# Patient Record
Sex: Male | Born: 1940 | Race: Black or African American | Hispanic: No | Marital: Married | Smoking: Current every day smoker
Health system: Southern US, Community
[De-identification: ages and names within clinical notes are randomized; demographics above are authoritative.]

## PROBLEM LIST (undated history)

## (undated) DIAGNOSIS — D751 Secondary polycythemia: Secondary | ICD-10-CM

## (undated) DIAGNOSIS — C61 Malignant neoplasm of prostate: Secondary | ICD-10-CM

## (undated) DIAGNOSIS — R350 Frequency of micturition: Secondary | ICD-10-CM

## (undated) DIAGNOSIS — I10 Essential (primary) hypertension: Secondary | ICD-10-CM

## (undated) DIAGNOSIS — R3129 Other microscopic hematuria: Secondary | ICD-10-CM

## (undated) DIAGNOSIS — N400 Enlarged prostate without lower urinary tract symptoms: Secondary | ICD-10-CM

## (undated) DIAGNOSIS — K219 Gastro-esophageal reflux disease without esophagitis: Secondary | ICD-10-CM

## (undated) DIAGNOSIS — N529 Male erectile dysfunction, unspecified: Secondary | ICD-10-CM

## (undated) DIAGNOSIS — E7800 Pure hypercholesterolemia, unspecified: Secondary | ICD-10-CM

## (undated) DIAGNOSIS — D649 Anemia, unspecified: Secondary | ICD-10-CM

## (undated) HISTORY — DX: Benign prostatic hyperplasia without lower urinary tract symptoms: N40.0

## (undated) HISTORY — DX: Gastro-esophageal reflux disease without esophagitis: K21.9

## (undated) HISTORY — DX: Pure hypercholesterolemia, unspecified: E78.00

## (undated) HISTORY — DX: Male erectile dysfunction, unspecified: N52.9

## (undated) HISTORY — DX: Anemia, unspecified: D64.9

## (undated) HISTORY — DX: Other microscopic hematuria: R31.29

## (undated) HISTORY — DX: Frequency of micturition: R35.0

## (undated) HISTORY — DX: Secondary polycythemia: D75.1

## (undated) HISTORY — DX: Malignant neoplasm of prostate: C61

## (undated) HISTORY — DX: Essential (primary) hypertension: I10

---

## 2006-09-02 DIAGNOSIS — C61 Malignant neoplasm of prostate: Secondary | ICD-10-CM

## 2006-09-02 HISTORY — DX: Malignant neoplasm of prostate: C61

## 2010-02-22 ENCOUNTER — Ambulatory Visit: Payer: Self-pay | Admitting: Urology

## 2010-04-02 ENCOUNTER — Ambulatory Visit: Payer: Self-pay | Admitting: Radiation Oncology

## 2010-04-30 ENCOUNTER — Ambulatory Visit: Payer: Self-pay | Admitting: Radiation Oncology

## 2010-05-03 ENCOUNTER — Ambulatory Visit: Payer: Self-pay | Admitting: Radiation Oncology

## 2010-05-19 ENCOUNTER — Emergency Department: Payer: Self-pay | Admitting: Emergency Medicine

## 2010-07-04 ENCOUNTER — Ambulatory Visit: Payer: Self-pay | Admitting: Radiation Oncology

## 2010-07-19 ENCOUNTER — Ambulatory Visit: Payer: Self-pay | Admitting: Urology

## 2010-07-31 ENCOUNTER — Ambulatory Visit: Payer: Self-pay | Admitting: Anesthesiology

## 2010-08-01 ENCOUNTER — Ambulatory Visit: Payer: Self-pay | Admitting: Urology

## 2010-08-02 ENCOUNTER — Ambulatory Visit: Payer: Self-pay | Admitting: Radiation Oncology

## 2010-09-02 ENCOUNTER — Ambulatory Visit: Payer: Self-pay | Admitting: Radiation Oncology

## 2010-09-17 ENCOUNTER — Inpatient Hospital Stay: Payer: Self-pay | Admitting: Internal Medicine

## 2010-09-18 LAB — PSA

## 2010-12-28 ENCOUNTER — Ambulatory Visit: Payer: Self-pay | Admitting: Radiation Oncology

## 2010-12-29 LAB — PSA

## 2011-01-01 ENCOUNTER — Ambulatory Visit: Payer: Self-pay | Admitting: Radiation Oncology

## 2011-06-27 ENCOUNTER — Ambulatory Visit: Payer: Self-pay | Admitting: Radiation Oncology

## 2011-07-04 ENCOUNTER — Ambulatory Visit: Payer: Self-pay | Admitting: Radiation Oncology

## 2012-06-26 ENCOUNTER — Ambulatory Visit: Payer: Self-pay | Admitting: Radiation Oncology

## 2012-07-03 ENCOUNTER — Ambulatory Visit: Payer: Self-pay | Admitting: Radiation Oncology

## 2012-08-10 DIAGNOSIS — N529 Male erectile dysfunction, unspecified: Secondary | ICD-10-CM | POA: Insufficient documentation

## 2012-08-10 DIAGNOSIS — R3129 Other microscopic hematuria: Secondary | ICD-10-CM | POA: Insufficient documentation

## 2012-08-10 DIAGNOSIS — R350 Frequency of micturition: Secondary | ICD-10-CM | POA: Insufficient documentation

## 2013-03-02 HISTORY — PX: CYSTOSCOPY: SUR368

## 2013-03-10 ENCOUNTER — Ambulatory Visit: Payer: Self-pay | Admitting: Urology

## 2013-03-24 ENCOUNTER — Ambulatory Visit: Payer: Self-pay | Admitting: Urology

## 2013-03-31 LAB — PATHOLOGY REPORT

## 2013-06-24 ENCOUNTER — Ambulatory Visit: Payer: Self-pay | Admitting: Radiation Oncology

## 2013-09-02 HISTORY — PX: TRANSURETHRAL RESECTION OF PROSTATE: SHX73

## 2014-03-10 DIAGNOSIS — E785 Hyperlipidemia, unspecified: Secondary | ICD-10-CM | POA: Insufficient documentation

## 2014-03-10 DIAGNOSIS — C61 Malignant neoplasm of prostate: Secondary | ICD-10-CM | POA: Insufficient documentation

## 2014-03-10 DIAGNOSIS — I10 Essential (primary) hypertension: Secondary | ICD-10-CM | POA: Insufficient documentation

## 2014-10-24 ENCOUNTER — Ambulatory Visit: Payer: Self-pay | Admitting: Internal Medicine

## 2014-11-01 ENCOUNTER — Ambulatory Visit
Admit: 2014-11-01 | Disposition: A | Discharge status: Home / Self Care | Payer: Self-pay | Attending: Internal Medicine | Admitting: Internal Medicine

## 2014-12-23 NOTE — Op Note (Signed)
PATIENT NAME:  Jerry Haynes, Jerry Haynes MR#:  [id number] DATE OF BIRTH:  1941-01-27  DATE OF PROCEDURE:  03/24/2013  PREOPERATIVE DIAGNOSES: 1.  Chronic urinary retention.  2.  Prostate cancer.   POSTOPERATIVE DIAGNOSES: 1.  Chronic urinary retention.  2.  Prostate cancer.   PROCEDURE:  Transurethral resection of prostate (channel).   SURGEON: John Giovanni, M.D.   ASSISTANT: None.   ANESTHESIA: General.   INDICATIONS: This is a 74 year old male with history of adenocarcinoma of the prostate diagnosed in 2008.  He initially elected active surveillance. However, opted for brachytherapy after a rising PSA. This was performed in 2011 and he developed acute urinary retention post procedure. He has had persistent urinary retention. He has been reluctant to pursue surgical management. He has been performing intermittent catheterization. He recently has opted for a channel TURP.  DESCRIPTION OF PROCEDURE: He was taken to the Operating Room where general anesthetic was administered. He was then placed in the low lithotomy position and his external genitalia were prepped and draped in the usual fashion. A timeout was performed per protocol with all in agreement to. A 27 French continuous flow resectoscope sheath with visual obturator was lubricated and passed per urethra. The obturator was replaced with an [place]. There was coapting tissue at the bladder neck and lateral lobes. Verumontanum was easily identified. No bladder mucosal lesions were identified. The ureteral orifices were normal-appearing with clear efflux and well away from the bladder neck. Bladder neck tissue was then resected down to capsular fibers. Lateral lobes were then resected from the bladder neck toward the verumontanum. There was adenoma around the verumontanum, which was superficially vaporized with the button electrode but not aggressively resected. Hemostasis was obtained with cautery. All chips were removed with  irrigation. Several prostate implant seeds were identified and sent with the specimen. At the completion of procedure, hemostasis was adequate the scope on minimal flow. The resectoscope was removed. A 22 French three-way Foley catheter was placed to continuous bladder irrigation. B and O suppository was placed per rectum. He was taken to the PACU in stable condition. There were no complications.   ESTIMATED BLOOD LOSS:  Minimal.     ____________________________ Ronda Fairly. Bernardo Heater, MD scs:dp D: 03/24/2013 13:52:14 ET T: 03/24/2013 16:09:12 ET JOB#: [id number]  cc: Nicki Reaper C. Bernardo Heater, MD, <Dictator> Abbie Sons MD ELECTRONICALLY SIGNED 04/05/2013 8:34

## 2015-10-19 ENCOUNTER — Encounter: Payer: Self-pay | Admitting: *Deleted

## 2015-10-19 ENCOUNTER — Other Ambulatory Visit: Payer: Self-pay | Admitting: *Deleted

## 2015-10-19 DIAGNOSIS — D751 Secondary polycythemia: Secondary | ICD-10-CM

## 2015-10-19 DIAGNOSIS — Z8546 Personal history of malignant neoplasm of prostate: Secondary | ICD-10-CM

## 2015-10-19 NOTE — Progress Notes (Signed)
Per md order, Added psa/cbc to patient's lab orders tomorrow.

## 2015-10-20 ENCOUNTER — Inpatient Hospital Stay: Payer: Medicare Other | Attending: Internal Medicine | Admitting: Internal Medicine

## 2015-10-20 ENCOUNTER — Inpatient Hospital Stay: Payer: Medicare Other

## 2015-10-20 VITALS — BP 156/93 | HR 87 | Temp 95.9°F | Ht 74.3 in | Wt 163.9 lb

## 2015-10-20 DIAGNOSIS — E7800 Pure hypercholesterolemia, unspecified: Secondary | ICD-10-CM

## 2015-10-20 DIAGNOSIS — I10 Essential (primary) hypertension: Secondary | ICD-10-CM | POA: Diagnosis not present

## 2015-10-20 DIAGNOSIS — C61 Malignant neoplasm of prostate: Secondary | ICD-10-CM

## 2015-10-20 DIAGNOSIS — K219 Gastro-esophageal reflux disease without esophagitis: Secondary | ICD-10-CM | POA: Insufficient documentation

## 2015-10-20 DIAGNOSIS — Z8546 Personal history of malignant neoplasm of prostate: Secondary | ICD-10-CM

## 2015-10-20 DIAGNOSIS — N400 Enlarged prostate without lower urinary tract symptoms: Secondary | ICD-10-CM | POA: Diagnosis not present

## 2015-10-20 DIAGNOSIS — Z79899 Other long term (current) drug therapy: Secondary | ICD-10-CM | POA: Insufficient documentation

## 2015-10-20 DIAGNOSIS — Z923 Personal history of irradiation: Secondary | ICD-10-CM | POA: Insufficient documentation

## 2015-10-20 DIAGNOSIS — D751 Secondary polycythemia: Secondary | ICD-10-CM

## 2015-10-20 DIAGNOSIS — F17210 Nicotine dependence, cigarettes, uncomplicated: Secondary | ICD-10-CM | POA: Insufficient documentation

## 2015-10-20 LAB — CBC WITH DIFFERENTIAL/PLATELET
Basophils Absolute: 0.1 10*3/uL (ref 0–0.1)
Basophils Relative: 1 %
EOS ABS: 0.1 10*3/uL (ref 0–0.7)
Eosinophils Relative: 1 %
HCT: 56.3 % — ABNORMAL HIGH (ref 40.0–52.0)
HEMOGLOBIN: 19.8 g/dL — AB (ref 13.0–18.0)
Lymphocytes Relative: 20 %
Lymphs Abs: 1.2 10*3/uL (ref 1.0–3.6)
MCH: 33.4 pg (ref 26.0–34.0)
MCHC: 35.1 g/dL (ref 32.0–36.0)
MCV: 95.2 fL (ref 80.0–100.0)
MONO ABS: 0.7 10*3/uL (ref 0.2–1.0)
MONOS PCT: 11 %
NEUTROS ABS: 4.2 10*3/uL (ref 1.4–6.5)
Neutrophils Relative %: 67 %
Platelets: 168 10*3/uL (ref 150–440)
RBC: 5.91 MIL/uL — ABNORMAL HIGH (ref 4.40–5.90)
RDW: 13.8 % (ref 11.5–14.5)
WBC: 6.3 10*3/uL (ref 3.8–10.6)

## 2015-10-20 LAB — PSA: PSA: 2.17 ng/mL (ref 0.00–4.00)

## 2015-10-20 NOTE — Progress Notes (Signed)
Patient brought to exam room 4, patient ambulates independently without assistance.  Vitals documented.  Patient denies pain or discomfort, states he has no medical changes since last visit.  Dr Rogue Bussing notified.

## 2015-10-20 NOTE — Progress Notes (Signed)
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   # 2008- Prostate cancer- Gleason score 3+3 [2011; s/p Brachytherapy]   INTERVAL HISTORY: Patient is a poor historian.  This is my first interaction with the patient since I joined the practice September 2016. I reviewed the patient's prior charts/pertinent labs in detail; findings are summarized above.   Pleasant 75 year old African-American male patient with above history of erythrocytosis likely secondary from smoking is here for follow-up.  Patient denies any difficulty breathing or headaches or history of blood clots.  Interestingly he denies even having a history of prostate cancer. Unfortunately continues to smoke.   REVIEW OF SYSTEMS:  A complete 10 point review of system is done which is negative except mentioned above/history of present illness.   PAST MEDICAL HISTORY :  Past Medical History  Diagnosis Date  . Polycythemia   . Prostate cancer ([place]) 2008    Gleeson 8, seed implant in 2011  . Hypercholesteremia   . Hypertension   . Male erectile dysfunction   . BPH (benign prostatic hyperplasia)   . Microscopic hematuria   . Urinary frequency   . GERD (gastroesophageal reflux disease)     PAST SURGICAL HISTORY :   Past Surgical History  Procedure Laterality Date  . Cystoscopy  03/2013    by Dr. Bernardo Heater    FAMILY HISTORY :  No family history on file.  SOCIAL HISTORY:   Social History  Substance Use Topics  . Smoking status: Current Every Day Smoker -- 0.25 packs/day for 51 years    Types: Cigarettes  . Smokeless tobacco: Never Used  . Alcohol Use: No    ALLERGIES:  has No Known Allergies.  MEDICATIONS:  Current Outpatient Prescriptions  Medication Sig Dispense Refill  . amLODipine (NORVASC) 5 MG  tablet Take 0.5 mg by mouth daily.    . simvastatin (ZOCOR) 40 MG tablet Take 40 mg by mouth Nightly.    . tamsulosin (FLOMAX) 0.4 MG CAPS capsule Take 4 mg by mouth daily.     No current facility-administered medications for this visit.    PHYSICAL EXAMINATION:   BP 156/93 mmHg  Pulse 87  Temp(Src) 95.9 F (35.5 C) (Tympanic)  Ht 6' 2.3" (1.887 m)  Wt 163 lb 14.6 oz (74.35 kg)  BMI 20.88 kg/m2  Filed Weights   10/20/15 0855  Weight: 163 lb 14.6 oz (74.35 kg)    GENERAL: Well-nourished well-developed; Alert, no distress and comfortable.   Alone.  EYES: no pallor or icterus OROPHARYNX: no thrush or ulceration; dentures.  NECK: supple, no masses felt LYMPH:  no palpable lymphadenopathy in the cervical, axillary or inguinal regions LUNGS: clear to auscultation and  No wheeze or crackles HEART/CVS: regular rate & rhythm and no murmurs; No lower extremity edema ABDOMEN:abdomen soft, non-tender and normal bowel sounds Musculoskeletal:no cyanosis of digits and no clubbing  PSYCH: alert & oriented x 3 with fluent speech NEURO: no focal motor/sensory deficits SKIN:  no rashes or significant lesions; sebaceous cyst in post scalp.    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 Clinically patient is asymptomatic. Patient's hematocrit is 56/hemoglobin is 19.8; I discussed my concerns regarding stroke/blood clots. I offered phlebotomy; patient declined. He wanted to talk to his PCP regarding phlebotomies. I also offered him to go to have cross for blood donation. He was recommended baby aspirin once a day.  # History of  prostate cancer status post brachytherapy in 2011. Interestingly patient denies history of any prostate cancer. No clinical concerns for recurrence. Check PSA at this time  # As the patient to give [place] a call if he is interested in phlebotomy. If not he'll follow-up with me in 6 months/CBC PSA/possible phlebotomy at that time.      Cammie Sickle, MD 10/20/2015 9:06 AM

## 2016-04-19 ENCOUNTER — Inpatient Hospital Stay: Payer: Medicare Other

## 2016-04-19 ENCOUNTER — Inpatient Hospital Stay: Payer: Medicare Other | Admitting: Internal Medicine

## 2016-05-08 ENCOUNTER — Inpatient Hospital Stay: Payer: Medicare Other

## 2016-05-08 ENCOUNTER — Inpatient Hospital Stay: Payer: Medicare Other | Admitting: Internal Medicine

## 2016-05-27 ENCOUNTER — Ambulatory Visit: Payer: Medicare Other | Admitting: Internal Medicine

## 2016-05-27 ENCOUNTER — Other Ambulatory Visit: Payer: Medicare Other

## 2016-07-15 ENCOUNTER — Other Ambulatory Visit: Payer: Self-pay | Admitting: Internal Medicine

## 2016-07-15 ENCOUNTER — Telehealth: Payer: Self-pay | Admitting: Internal Medicine

## 2016-07-15 ENCOUNTER — Telehealth: Payer: Self-pay | Admitting: *Deleted

## 2016-07-15 DIAGNOSIS — D751 Secondary polycythemia: Secondary | ICD-10-CM

## 2016-07-15 NOTE — Telephone Encounter (Signed)
Reviewed the records from Dr. Thomes Dinning office' pt H&H- pn 10/31- 22/62; recommend phlebotomy.   Start H&H weekly/phlebotomy x4;  ASAP;Heather, please schedule and then have pt see me in 5 weeks/H&H/phelbotomy.   Thx

## 2016-07-15 NOTE — Addendum Note (Signed)
Addended by: Renita Papa R on: 07/15/2016 11:25 AM   Modules accepted: Orders

## 2016-07-15 NOTE — Telephone Encounter (Addendum)
Orders entered for cancer ctr sch. Team to arrange. Unable to accomodate phleb. Today on infusion sch, so asked sch. Team to arrange starting tomorrow 07/16/16

## 2016-07-15 NOTE — Telephone Encounter (Signed)
Called pt with appt to come in tomorrow, but he stated he cannot come at all this week he is very busy. He stated he will call us back to reschedule.

## 2016-07-16 ENCOUNTER — Inpatient Hospital Stay: Payer: Medicare Other

## 2016-07-23 ENCOUNTER — Other Ambulatory Visit: Payer: Self-pay | Admitting: *Deleted

## 2016-07-23 ENCOUNTER — Inpatient Hospital Stay: Payer: Medicare Other | Attending: Internal Medicine

## 2016-07-23 ENCOUNTER — Inpatient Hospital Stay: Payer: Medicare Other

## 2016-07-23 VITALS — BP 116/81 | HR 94 | Temp 97.9°F | Resp 18

## 2016-07-23 DIAGNOSIS — D751 Secondary polycythemia: Secondary | ICD-10-CM | POA: Diagnosis present

## 2016-07-23 LAB — HEMATOCRIT: HCT: 61.4 % — ABNORMAL HIGH (ref 40.0–52.0)

## 2016-07-24 LAB — HEMOGLOBIN: Hemoglobin: 21.4 g/dL (ref 13.0–17.0)

## 2016-07-29 ENCOUNTER — Telehealth: Payer: Self-pay | Admitting: *Deleted

## 2016-07-29 NOTE — Telephone Encounter (Signed)
Returned patient's daughters phone call regarding the need for patient's follow up. Explained diagnosis and treatment plan per Dr.Brahmandy's last note. She expressed understanding and will speak to her father about compliance with plan. He has stated that he will not go to any more appointments.

## 2016-07-30 ENCOUNTER — Ambulatory Visit: Payer: Medicare Other

## 2016-07-30 ENCOUNTER — Inpatient Hospital Stay: Payer: Medicare Other

## 2016-07-30 ENCOUNTER — Other Ambulatory Visit: Payer: Medicare Other

## 2016-07-30 DIAGNOSIS — D751 Secondary polycythemia: Secondary | ICD-10-CM

## 2016-07-30 LAB — HEMOGLOBIN: Hemoglobin: 19.5 g/dL — ABNORMAL HIGH (ref 13.0–18.0)

## 2016-07-30 LAB — HEMATOCRIT: HCT: 56.2 % — ABNORMAL HIGH (ref 40.0–52.0)

## 2016-08-06 ENCOUNTER — Inpatient Hospital Stay: Payer: Medicare Other

## 2016-08-06 ENCOUNTER — Inpatient Hospital Stay: Payer: Medicare Other | Attending: Internal Medicine

## 2016-08-06 ENCOUNTER — Ambulatory Visit: Payer: Medicare Other | Admitting: Urology

## 2016-08-06 ENCOUNTER — Encounter: Payer: Self-pay | Admitting: Urology

## 2016-08-06 VITALS — BP 120/78

## 2016-08-06 VITALS — BP 135/78 | HR 91 | Ht 73.0 in | Wt 159.0 lb

## 2016-08-06 DIAGNOSIS — N400 Enlarged prostate without lower urinary tract symptoms: Secondary | ICD-10-CM

## 2016-08-06 DIAGNOSIS — Z8546 Personal history of malignant neoplasm of prostate: Secondary | ICD-10-CM | POA: Diagnosis not present

## 2016-08-06 DIAGNOSIS — K219 Gastro-esophageal reflux disease without esophagitis: Secondary | ICD-10-CM | POA: Insufficient documentation

## 2016-08-06 DIAGNOSIS — C61 Malignant neoplasm of prostate: Secondary | ICD-10-CM

## 2016-08-06 DIAGNOSIS — E7800 Pure hypercholesterolemia, unspecified: Secondary | ICD-10-CM | POA: Diagnosis not present

## 2016-08-06 DIAGNOSIS — R350 Frequency of micturition: Secondary | ICD-10-CM | POA: Diagnosis not present

## 2016-08-06 DIAGNOSIS — D751 Secondary polycythemia: Secondary | ICD-10-CM | POA: Diagnosis not present

## 2016-08-06 DIAGNOSIS — I10 Essential (primary) hypertension: Secondary | ICD-10-CM | POA: Insufficient documentation

## 2016-08-06 DIAGNOSIS — R3129 Other microscopic hematuria: Secondary | ICD-10-CM | POA: Insufficient documentation

## 2016-08-06 DIAGNOSIS — F17210 Nicotine dependence, cigarettes, uncomplicated: Secondary | ICD-10-CM | POA: Insufficient documentation

## 2016-08-06 LAB — HEMOGLOBIN: Hemoglobin: 18.8 g/dL — ABNORMAL HIGH (ref 13.0–18.0)

## 2016-08-06 LAB — HEMATOCRIT: HEMATOCRIT: 53.6 % — AB (ref 40.0–52.0)

## 2016-08-06 NOTE — Progress Notes (Signed)
08/06/2016 11:09 AM   Jerry Haynes 06-02-1941 [id number]  Referring provider: Madelyn Brunner, MD [facility] [place], [place] [address]  Chief Complaint  Patient presents with  . Prostate Cancer    New Patient    HPI: Jerry Haynes is a 75yo seen today for BPh with LUTS and prostate cancer. He underwent TURP in 2014 for BPH and was found to prostate cancer in the specimen. He was last seen by Dr. Jacqlyn Larsen in 12/2013. Pathology was Gleason 3+3=6 in 5% of specimen. PSA at that time was 0.6. Most recent PSA in 2.17 in 10/2015. He is currently on flomax for his BPH. He denies any significant LUTS.  He denies nocturia, urgency, frequency q5-6 hours, no hematuria, dysuria.    PMH: Past Medical History:  Diagnosis Date  . BPH (benign prostatic hyperplasia)   . GERD (gastroesophageal reflux disease)   . Hypercholesteremia   . Hypertension   . Male erectile dysfunction   . Microscopic hematuria   . Polycythemia   . Prostate cancer ([place]) 2008   Gleeson 8, seed implant in 2011  . Urinary frequency     Surgical History: Past Surgical History:  Procedure Laterality Date  . CYSTOSCOPY  03/2013   by Dr. Bernardo Heater  . TRANSURETHRAL RESECTION OF PROSTATE  2015    Home Medications:    Medication List       Accurate as of 08/06/16 11:09 AM. Always use your most recent med list.          amLODipine 5 MG tablet Commonly known as:  NORVASC Take 0.5 mg by mouth daily.   simvastatin 40 MG tablet Commonly known as:  ZOCOR Take 40 mg by mouth Nightly.   tamsulosin 0.4 MG Caps capsule Commonly known as:  FLOMAX Take 4 mg by mouth daily.       Allergies: No Known Allergies  Family History: Family History  Problem Relation Age of Onset  . Prostate cancer Brother     Social History:  reports that he has been smoking Cigarettes.  He has a 12.75 pack-year smoking history. He has never used smokeless tobacco. He reports that he does not drink alcohol or  use drugs.  ROS: UROLOGY Frequent Urination?: No Hard to postpone urination?: No Burning/pain with urination?: No Get up at night to urinate?: No Leakage of urine?: No Urine stream starts and stops?: No Trouble starting stream?: No Do you have to strain to urinate?: No Blood in urine?: No Urinary tract infection?: No Sexually transmitted disease?: No Injury to kidneys or bladder?: No Painful intercourse?: No Weak stream?: No Erection problems?: No Penile pain?: No  Gastrointestinal Nausea?: No Vomiting?: No Indigestion/heartburn?: No Diarrhea?: Yes Constipation?: No  Constitutional Fever: No Night sweats?: No Weight loss?: No Fatigue?: No  Skin Skin rash/lesions?: No Itching?: No  Eyes Blurred vision?: No Double vision?: No  Ears/Nose/Throat Sore throat?: No Sinus problems?: No  Hematologic/Lymphatic Swollen glands?: No Easy bruising?: No  Cardiovascular Leg swelling?: No Chest pain?: No  Respiratory Cough?: No Shortness of breath?: No  Endocrine Excessive thirst?: No  Musculoskeletal Back pain?: No Joint pain?: No  Neurological Headaches?: No Dizziness?: No  Psychologic Depression?: No Anxiety?: No  Physical Exam: BP 135/78   Pulse 91   Ht 6\' 1"  (1.854 m)   Wt 72.1 kg (159 lb)   BMI 20.98 kg/m   Constitutional:  Alert and oriented, No acute distress. HEENT: [place] AT, moist mucus membranes.  Trachea midline,  no masses. Cardiovascular: No clubbing, cyanosis, or edema. Respiratory: Normal respiratory effort, no increased work of breathing. GI: Abdomen is soft, nontender, nondistended, no abdominal masses GU: No CVA tenderness. Uncircumcised phallus. No masses/lesions on penis and testis. Prostate 30g smooth, no nodules, no induration Skin: No rashes, bruises or suspicious lesions. Lymph: No cervical or inguinal adenopathy. Neurologic: Grossly intact, no focal deficits, moving all 4 extremities. Psychiatric: Normal mood and  affect.  Laboratory Data: Lab Results  Component Value Date   WBC 6.3 10/20/2015   HGB 19.5 (H) 07/30/2016   HCT 56.2 (H) 07/30/2016   MCV 95.2 10/20/2015   PLT 168 10/20/2015    No results found for: CREATININE  Lab Results  Component Value Date   PSA 2.17 10/20/2015   PSA  12/28/2010    ========== TEST NAME ==========  ========= RESULTS =========  = REFERENCE RANGE =  PROSTATE-SPECIFIC AG.  PSA, Serum (Serial Monitor) Prostate Specific Ag, Serum     [   2.5 ng/mL            ]           0.0-4.0 Roche ECLIA methodology.                                                                      . According to the American Urological Association, Serum PSA should decrease and remain at undetectable levels after radical prostatectomy. The AUA defines biochemical recurrence as an initial PSA value 0.2 ng/mL or greater followed by a subsequent confirmatory PSA value 0.2 ng/mL or greater. Values obtained with different assay methods or kits cannot be used interchangeably. Results cannot be interpreted as absolute evidence of the presence or absence of malignant disease.               [facility]            No: [id number]           [address] [place], [place] [address]           Lindon Romp, MD         [phone]   Result(s) reported on 29 Dec 2010 at 05:17PM.    PSA  09/17/2010    ========== TEST NAME ==========  ========= RESULTS =========  = REFERENCE RANGE =  PROSTATE-SPECIFIC AG.  PSA, Serum (Serial Monitor) Prostate Specific Ag, Serum     [H  9.8 ng/mL            ]           0.0-4.0 Roche ECLIA methodology.                                                                      . According to the American Urological Association, Serum PSA should decrease and remain at undetectable levels after radical prostatectomy. The AUA defines biochemical recurrence as an initial PSA value 0.2 ng/mL or greater followed by a subsequent confirmatory PSA value 0.2  ng/mL or greater. Values obtained  with different assay methods or kits cannot be used interchangeably. Results cannot be interpreted as absolute evidence of the presence or absence of malignant disease.               [facility]            No: [id number]           [address] [place], [place] [address]           Lindon Romp, MD         [phone]   Result(s) reported on 18 Sep 2010 at 12:49PM.     No results found for: TESTOSTERONE  No results found for: HGBA1C  Urinalysis No results found for: COLORURINE, APPEARANCEUR, LABSPEC, PHURINE, GLUCOSEU, HGBUR, BILIRUBINUR, KETONESUR, PROTEINUR, UROBILINOGEN, NITRITE, LEUKOCYTESUR  Pertinent Imaging: none  Assessment & Plan:    1. Prostate cancer ([place]) - PSA today, will call with results. If stable RTC 6 months with PSA  2. BPH without LUTS -continue flomax 0.4mg  daily   No Follow-up on file.  Nicolette Bang, MD  [facility] [address], [place], [place] [address] [phone]

## 2016-08-07 LAB — PSA: PROSTATE SPECIFIC AG, SERUM: 3.2 ng/mL (ref 0.0–4.0)

## 2016-08-13 ENCOUNTER — Inpatient Hospital Stay: Payer: Medicare Other

## 2016-08-13 VITALS — BP 110/76 | HR 95 | Resp 18

## 2016-08-13 DIAGNOSIS — D751 Secondary polycythemia: Secondary | ICD-10-CM

## 2016-08-13 LAB — HEMOGLOBIN: HEMOGLOBIN: 17.6 g/dL (ref 13.0–18.0)

## 2016-08-13 LAB — HEMATOCRIT: HEMATOCRIT: 50.5 % (ref 40.0–52.0)

## 2016-08-13 NOTE — Progress Notes (Signed)
Spoke with Dr. Rogue Bussing and said to go ahead with phlebotomy today. LJ

## 2016-08-19 ENCOUNTER — Inpatient Hospital Stay: Payer: Medicare Other

## 2016-08-19 ENCOUNTER — Encounter: Payer: Self-pay | Admitting: Oncology

## 2016-08-19 ENCOUNTER — Inpatient Hospital Stay (HOSPITAL_BASED_OUTPATIENT_CLINIC_OR_DEPARTMENT_OTHER): Payer: Medicare Other | Admitting: Oncology

## 2016-08-19 VITALS — BP 125/83 | HR 96 | Temp 97.5°F | Resp 18 | Ht 73.0 in | Wt 158.7 lb

## 2016-08-19 DIAGNOSIS — K219 Gastro-esophageal reflux disease without esophagitis: Secondary | ICD-10-CM

## 2016-08-19 DIAGNOSIS — Z8546 Personal history of malignant neoplasm of prostate: Secondary | ICD-10-CM

## 2016-08-19 DIAGNOSIS — F17210 Nicotine dependence, cigarettes, uncomplicated: Secondary | ICD-10-CM

## 2016-08-19 DIAGNOSIS — D751 Secondary polycythemia: Secondary | ICD-10-CM | POA: Diagnosis not present

## 2016-08-19 DIAGNOSIS — N400 Enlarged prostate without lower urinary tract symptoms: Secondary | ICD-10-CM

## 2016-08-19 DIAGNOSIS — I10 Essential (primary) hypertension: Secondary | ICD-10-CM

## 2016-08-19 DIAGNOSIS — R3129 Other microscopic hematuria: Secondary | ICD-10-CM

## 2016-08-19 DIAGNOSIS — E7800 Pure hypercholesterolemia, unspecified: Secondary | ICD-10-CM

## 2016-08-19 DIAGNOSIS — R350 Frequency of micturition: Secondary | ICD-10-CM

## 2016-08-19 LAB — HEMATOCRIT: HCT: 51.2 % (ref 40.0–52.0)

## 2016-08-19 LAB — HEMOGLOBIN: HEMOGLOBIN: 17.9 g/dL (ref 13.0–18.0)

## 2016-08-19 NOTE — Progress Notes (Signed)
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 At that time he had JAK2 as well as exon12 mutation testing done which was negative. EPO was normal at 5.9. Patient has not had a bone marrow biopsy. CT abdomen back in 2011 did not reveal any evidence of intra-abdominal malignancy that could be contributing to polycythemia. Patient remained asymptomatic but has been requiring intermittent phlebotomy when his hematocrit is more than 60  Interval history- patient recently had 4 weekly sessions of phlebotomy up until last week. He is currently asymptomatic and denies any symptoms of headaches, lightheadedness chest pain or strokelike symptoms. He does not report feeling much different with or without phlebotomy. He reports getting a good night sleep and makes up in the morning feeling refreshed. He continues to smoke about 8-10 cigarettes per day    Review of systems- Review of Systems  Constitutional: Negative for chills, fever, malaise/fatigue and weight loss.  HENT: Negative for congestion, ear discharge and nosebleeds.   Eyes: Negative for blurred vision.  Respiratory: Negative for cough, hemoptysis, sputum production, shortness of breath and wheezing.   Cardiovascular: Negative for chest pain, palpitations, orthopnea and claudication.  Gastrointestinal: Negative for abdominal pain, blood in stool, constipation, diarrhea, heartburn, melena, nausea and vomiting.  Genitourinary: Negative for  dysuria, flank pain, frequency, hematuria and urgency.  Musculoskeletal: Negative for back pain, joint pain and myalgias.  Skin: Negative for rash.  Neurological: Negative for dizziness, tingling, focal weakness, seizures, weakness and headaches.  Endo/Heme/Allergies: Does not bruise/bleed easily.  Psychiatric/Behavioral: Negative for depression and suicidal ideas. The patient does not have insomnia.      Current treatment- intermittent phlebotomy  No Known Allergies   Past Medical History:  Diagnosis Date   BPH (benign prostatic hyperplasia)    GERD (gastroesophageal reflux disease)    Hypercholesteremia    Hypertension    Male erectile dysfunction    Microscopic hematuria    Polycythemia    Prostate cancer ([place]) 2008   Gleeson 8, seed implant in 2011   Urinary frequency      Past Surgical History:  Procedure Laterality Date   CYSTOSCOPY  03/2013   by Dr. Bernardo Heater   TRANSURETHRAL RESECTION OF PROSTATE  2015    Social History   Social History   Marital status: Married    Spouse name: N/A   Number of children: N/A   Years of education: N/A   Occupational History   Not on file.   Social History Main Topics   Smoking status: Current Every Day Smoker    Packs/day: 0.25    Years: 51.00    Types: Cigarettes   Smokeless tobacco: Never Used   Alcohol use No   Drug use: No   Sexual activity: No   Other Topics Concern   Not on file   Social History Narrative   No narrative on file    Family History  Problem Relation Age of Onset   Prostate cancer Brother      Current Outpatient Prescriptions:    amLODipine (NORVASC) 5  MG tablet, Take 0.5 mg by mouth daily., Disp: , Rfl:    simvastatin (ZOCOR) 40 MG tablet, Take 40 mg by mouth Nightly., Disp: , Rfl:    tamsulosin (FLOMAX) 0.4 MG CAPS capsule, Take 4 mg by mouth daily., Disp: , Rfl:   Physical exam:  Vitals:   08/19/16 1122  BP: 125/83  Pulse: 96  Resp: 18  Temp: 97.5 F (36.4  C)  TempSrc: Tympanic  Weight: 158 lb 11.7 oz (72 kg)  Height: '6\' 1"'  (1.854 m)   Physical Exam  Constitutional: He is oriented to person, place, and time and well-developed, well-nourished, and in no distress.  HENT:  Head: Normocephalic and atraumatic.  Eyes: EOM are normal. Pupils are equal, round, and reactive to light.  Neck: Normal range of motion.  Cardiovascular: Normal rate, regular rhythm and normal heart sounds.   Pulmonary/Chest: Effort normal and breath sounds normal.  Abdominal: Soft. Bowel sounds are normal.  Neurological: He is alert and oriented to person, place, and time.  Skin: Skin is warm and dry.     No flowsheet data found. CBC Latest Ref Rng & Units 08/19/2016  WBC 3.8 - 10.6 K/uL -  Hemoglobin 13.0 - 18.0 g/dL 17.9  Hematocrit 40.0 - 52.0 % 51.2  Platelets 150 - 440 K/uL -      Assessment and plan- Patient is a 75 y.o. 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  1. Patient's hematocrit is 51.2 today and we will hold off on phlebotomy at this time. We will consider phlebotomy when his hematocrit is more than 60. He will come back in 1 month's time for repeat CBC and in 3 months time for M.D. visit with labs. Based on history he does not seem to have signs and symptoms of obstructive sleep apnea that could be contributing to secondary polycythemia.   Visit Diagnosis 1. Polycythemia, secondary      Dr. Randa Evens, MD, MPH [place] [facility] 08/19/2016 12:16 PM

## 2016-08-23 ENCOUNTER — Ambulatory Visit: Payer: Self-pay | Admitting: Urology

## 2016-09-16 ENCOUNTER — Inpatient Hospital Stay: Payer: Medicare Other | Attending: Internal Medicine

## 2016-09-16 DIAGNOSIS — D751 Secondary polycythemia: Secondary | ICD-10-CM | POA: Insufficient documentation

## 2016-09-16 LAB — HEMOGLOBIN: Hemoglobin: 17.6 g/dL (ref 13.0–18.0)

## 2016-09-16 LAB — HEMATOCRIT: HCT: 52 % (ref 40.0–52.0)

## 2016-11-19 ENCOUNTER — Ambulatory Visit: Payer: Medicare Other | Admitting: Oncology

## 2016-11-19 ENCOUNTER — Other Ambulatory Visit: Payer: Medicare Other

## 2016-11-26 ENCOUNTER — Inpatient Hospital Stay: Payer: Medicare Other

## 2016-11-26 ENCOUNTER — Inpatient Hospital Stay: Payer: Medicare Other | Admitting: Oncology

## 2016-11-28 ENCOUNTER — Inpatient Hospital Stay (HOSPITAL_BASED_OUTPATIENT_CLINIC_OR_DEPARTMENT_OTHER): Payer: Medicare Other | Admitting: Oncology

## 2016-11-28 ENCOUNTER — Inpatient Hospital Stay: Payer: Medicare Other | Attending: Oncology

## 2016-11-28 VITALS — BP 132/82 | HR 90 | Temp 94.2°F | Resp 16 | Wt 159.0 lb

## 2016-11-28 DIAGNOSIS — F17210 Nicotine dependence, cigarettes, uncomplicated: Secondary | ICD-10-CM | POA: Insufficient documentation

## 2016-11-28 DIAGNOSIS — I10 Essential (primary) hypertension: Secondary | ICD-10-CM

## 2016-11-28 DIAGNOSIS — K219 Gastro-esophageal reflux disease without esophagitis: Secondary | ICD-10-CM | POA: Diagnosis not present

## 2016-11-28 DIAGNOSIS — E7800 Pure hypercholesterolemia, unspecified: Secondary | ICD-10-CM | POA: Diagnosis not present

## 2016-11-28 DIAGNOSIS — D751 Secondary polycythemia: Secondary | ICD-10-CM

## 2016-11-28 DIAGNOSIS — Z79899 Other long term (current) drug therapy: Secondary | ICD-10-CM

## 2016-11-28 DIAGNOSIS — R350 Frequency of micturition: Secondary | ICD-10-CM | POA: Insufficient documentation

## 2016-11-28 DIAGNOSIS — N529 Male erectile dysfunction, unspecified: Secondary | ICD-10-CM

## 2016-11-28 DIAGNOSIS — R3129 Other microscopic hematuria: Secondary | ICD-10-CM | POA: Insufficient documentation

## 2016-11-28 DIAGNOSIS — Z8546 Personal history of malignant neoplasm of prostate: Secondary | ICD-10-CM | POA: Insufficient documentation

## 2016-11-28 DIAGNOSIS — N400 Enlarged prostate without lower urinary tract symptoms: Secondary | ICD-10-CM | POA: Insufficient documentation

## 2016-11-28 LAB — CBC WITH DIFFERENTIAL/PLATELET
BASOS ABS: 0 10*3/uL (ref 0–0.1)
Basophils Relative: 1 %
Eosinophils Absolute: 0 10*3/uL (ref 0–0.7)
Eosinophils Relative: 1 %
HEMATOCRIT: 49.5 % (ref 40.0–52.0)
Hemoglobin: 17.5 g/dL (ref 13.0–18.0)
Lymphocytes Relative: 16 %
Lymphs Abs: 1 10*3/uL (ref 1.0–3.6)
MCH: 33.5 pg (ref 26.0–34.0)
MCHC: 35.5 g/dL (ref 32.0–36.0)
MCV: 94.4 fL (ref 80.0–100.0)
MONO ABS: 0.4 10*3/uL (ref 0.2–1.0)
MONOS PCT: 7 %
NEUTROS ABS: 4.5 10*3/uL (ref 1.4–6.5)
Neutrophils Relative %: 75 %
Platelets: 241 10*3/uL (ref 150–440)
RBC: 5.24 MIL/uL (ref 4.40–5.90)
RDW: 13.6 % (ref 11.5–14.5)
WBC: 6 10*3/uL (ref 3.8–10.6)

## 2016-11-28 NOTE — Progress Notes (Signed)
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 At that time he had JAK2 as well as exon12 mutation testing done which was negative. EPO was normal at 5.9. Patient has not had a bone marrow biopsy. CT abdomen back in 2011 did not reveal any evidence of intra-abdominal malignancy that could be contributing to polycythemia. Patient remained asymptomatic but has been requiring intermittent phlebotomy when his hematocrit is more than 60. Last phlebotomy was in December 2017  Interval history- patient continues to smoke but is not interested in quitting. He is doing well and reports no symptoms of fatigue, chest pain or strokelike symptoms  ECOG PS- 1 Pain scale- 0   Review of systems- Review of Systems  Constitutional: Negative for chills, fever, malaise/fatigue and weight loss.  HENT: Negative for congestion, ear discharge and nosebleeds.   Eyes: Negative for blurred vision.  Respiratory: Negative for cough, hemoptysis, sputum production, shortness of breath and wheezing.   Cardiovascular: Negative for chest pain, palpitations, orthopnea and claudication.  Gastrointestinal: Negative for abdominal pain, blood in stool, constipation, diarrhea, heartburn, melena, nausea and vomiting.  Genitourinary: Negative for dysuria, flank pain, frequency, hematuria and urgency.  Musculoskeletal: Negative for back pain, joint pain and myalgias.  Skin: Negative for rash.  Neurological: Negative for dizziness, tingling, focal weakness, seizures, weakness and  headaches.  Endo/Heme/Allergies: Does not bruise/bleed easily.  Psychiatric/Behavioral: Negative for depression and suicidal ideas. The patient does not have insomnia.      Current treatment- intermittent phlebotomy  No Known Allergies   Past Medical History:  Diagnosis Date  . BPH (benign prostatic hyperplasia)   . GERD (gastroesophageal reflux disease)   . Hypercholesteremia   . Hypertension   . Male erectile dysfunction   . Microscopic hematuria   . Polycythemia   . Prostate cancer ([place]) 2008   Gleeson 8, seed implant in 2011  . Urinary frequency      Past Surgical History:  Procedure Laterality Date  . CYSTOSCOPY  03/2013   by Dr. Bernardo Heater  . TRANSURETHRAL RESECTION OF PROSTATE  2015    Social History   Social History  . Marital status: Married    Spouse name: N/A  . Number of children: N/A  . Years of education: N/A   Occupational History  . Not on file.   Social History Main Topics  . Smoking status: Current Every Day Smoker    Packs/day: 0.25    Years: 51.00    Types: Cigarettes  . Smokeless tobacco: Never Used  . Alcohol use No  . Drug use: No  . Sexual activity: No   Other Topics Concern  . Not on file   Social History Narrative  . No narrative on file    Family History  Problem Relation Age of Onset  . Prostate cancer Brother      Current Outpatient Prescriptions:  .  amLODipine (NORVASC) 5 MG tablet, Take 5 mg by mouth daily., Disp: , Rfl:  .  simvastatin (ZOCOR) 20 MG tablet, Take 20 mg by mouth daily., Disp: , Rfl:  .  tamsulosin (FLOMAX) 0.4 MG  CAPS capsule, Take 4 mg by mouth daily., Disp: , Rfl:   Physical exam:  Vitals:   11/28/16 1057  BP: 132/82  Pulse: 90  Resp: 16  Temp: (!) 94.2 F (34.6 C)  TempSrc: Tympanic  Weight: 159 lb (72.1 kg)   Physical Exam  Constitutional: He is oriented to person, place, and time and well-developed, well-nourished, and in no distress.  HENT:  Head: Normocephalic and atraumatic.    Eyes: EOM are normal. Pupils are equal, round, and reactive to light.  Neck: Normal range of motion.  Cardiovascular: Normal rate, regular rhythm and normal heart sounds.   Pulmonary/Chest: Effort normal and breath sounds normal.  Abdominal: Soft. Bowel sounds are normal.  Neurological: He is alert and oriented to person, place, and time.  Skin: Skin is warm and dry.     No flowsheet data found. CBC Latest Ref Rng & Units 11/28/2016  WBC 3.8 - 10.6 K/uL 6.0  Hemoglobin 13.0 - 18.0 g/dL 17.5  Hematocrit 40.0 - 52.0 % 49.5  Platelets 150 - 440 K/uL 241    No images are attached to the encounter.  No results found.   Assessment and plan- Patient is a 76 y.o. 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 He does not need a phlebotomy today. We will consider phlebotomy in the future should his hemoglobin be more than 20 years hematocrit more than 60. We will check a repeat CBC in 6 weeks' time and he will see me back in 3 months with repeat CBC for possible phlebotomy   Visit Diagnosis 1. Polycythemia, secondary      Dr. Randa Evens, MD, MPH [facility] at [facility] Pager- [phone] 11/28/2016 1:43 PM

## 2016-11-28 NOTE — Progress Notes (Signed)
Patient here today for follow up.  Patient states no now concerns today

## 2017-01-09 ENCOUNTER — Inpatient Hospital Stay: Payer: Medicare Other | Attending: Oncology

## 2017-01-14 ENCOUNTER — Telehealth: Payer: Self-pay | Admitting: *Deleted

## 2017-01-14 NOTE — Telephone Encounter (Signed)
Called to report that he missed his appt on 5/9 for lab only, but he had labs for PCP on 5/2 Hgb 18.3 HCT 51.1. Asking if these labs could be used in place of the ones for 5/9. I told her that would be fine and confirmed his next appt 6/29

## 2017-02-06 ENCOUNTER — Ambulatory Visit (INDEPENDENT_AMBULATORY_CARE_PROVIDER_SITE_OTHER): Payer: Medicare Other | Admitting: Urology

## 2017-02-06 ENCOUNTER — Encounter: Payer: Self-pay | Admitting: Urology

## 2017-02-06 VITALS — BP 110/75 | HR 80 | Ht 73.0 in | Wt 160.0 lb

## 2017-02-06 DIAGNOSIS — C61 Malignant neoplasm of prostate: Secondary | ICD-10-CM

## 2017-02-06 DIAGNOSIS — N400 Enlarged prostate without lower urinary tract symptoms: Secondary | ICD-10-CM

## 2017-02-06 NOTE — Progress Notes (Signed)
02/06/2017 10:05 AM   Jerry Haynes 1941-04-08 [id number]  Referring provider: Madelyn Brunner, MD [place] [facility] [place], [place] [address]  Chief Complaint  Patient presents with  . Prostate Cancer    HPI: Jerry Haynes is a 76yo seen today for BPh with LUTS and prostate cancer. He received brachytherapy for prostate cancer in 2011. PSA and pathology reports are not available to me. He underwent TURP in 2014 for BPH and was found to prostate cancer in the specimen.  Pathology was Gleason 3+3=6 in 5% of specimen. He elected to undergo surveillance that time. PSA at that time was 0.6. Most recent PSA in 2.17 in 10/2015. PSA in December 2017 was 3.2. He is currently on flomax for his BPH. He denies any significant LUTS.  DRE benign 12/17.  The patient is a very poor historian and does not remember undergoing these procedures. His wife however does recall these procedures.  PMH: Past Medical History:  Diagnosis Date  . BPH (benign prostatic hyperplasia)   . GERD (gastroesophageal reflux disease)   . Hypercholesteremia   . Hypertension   . Male erectile dysfunction   . Microscopic hematuria   . Polycythemia   . Prostate cancer ([place]) 2008   Gleeson 8, seed implant in 2011  . Urinary frequency     Surgical History: Past Surgical History:  Procedure Laterality Date  . CYSTOSCOPY  03/2013   by Dr. Bernardo Heater  . TRANSURETHRAL RESECTION OF PROSTATE  2015    Home Medications:  Allergies as of 02/06/2017   No Known Allergies     Medication List       Accurate as of 02/06/17 10:05 AM. Always use your most recent med list.          amLODipine 5 MG tablet Commonly known as:  NORVASC Take 5 mg by mouth daily.   simvastatin 20 MG tablet Commonly known as:  ZOCOR Take 20 mg by mouth daily.   tamsulosin 0.4 MG Caps capsule Commonly known as:  FLOMAX Take 4 mg by mouth daily.       Allergies: No Known Allergies  Family History: Family History    Problem Relation Age of Onset  . Prostate cancer Brother     Social History:  reports that he has been smoking Cigarettes.  He has a 12.75 pack-year smoking history. He has never used smokeless tobacco. He reports that he does not drink alcohol or use drugs.  ROS: UROLOGY Frequent Urination?: No Hard to postpone urination?: No Burning/pain with urination?: No Get up at night to urinate?: No Leakage of urine?: No Urine stream starts and stops?: No Trouble starting stream?: No Do you have to strain to urinate?: No Blood in urine?: No Urinary tract infection?: No Sexually transmitted disease?: No Injury to kidneys or bladder?: No Painful intercourse?: No Weak stream?: No Erection problems?: No Penile pain?: No  Gastrointestinal Nausea?: No Vomiting?: No Indigestion/heartburn?: No Diarrhea?: No Constipation?: No  Constitutional Fever: No Night sweats?: No Weight loss?: No Fatigue?: No  Skin Skin rash/lesions?: No Itching?: No  Eyes Blurred vision?: Yes Double vision?: No  Ears/Nose/Throat Sore throat?: No Sinus problems?: No  Hematologic/Lymphatic Swollen glands?: No Easy bruising?: No  Cardiovascular Leg swelling?: No Chest pain?: No  Respiratory Cough?: No Shortness of breath?: No  Endocrine Excessive thirst?: No  Musculoskeletal Back pain?: No Joint pain?: No  Neurological Headaches?: No Dizziness?: No  Psychologic Depression?: No Anxiety?: No  Physical Exam: BP 110/75 (  BP Location: Left Arm, Patient Position: Sitting, Cuff Size: Normal)   Pulse 80   Ht 6\' 1"  (1.854 m)   Wt 160 lb (72.6 kg)   BMI 21.11 kg/m   Constitutional:  Alert and oriented, No acute distress. HEENT: [place] AT, moist mucus membranes.  Trachea midline, no masses. Cardiovascular: No clubbing, cyanosis, or edema. Respiratory: Normal respiratory effort, no increased work of breathing. GI: Abdomen is soft, nontender, nondistended, no abdominal masses GU: No CVA  tenderness.  Skin: No rashes, bruises or suspicious lesions. Lymph: No cervical or inguinal adenopathy. Neurologic: Grossly intact, no focal deficits, moving all 4 extremities. Psychiatric: Normal mood and affect.  Laboratory Data: Lab Results  Component Value Date   WBC 6.0 11/28/2016   HGB 17.5 11/28/2016   HCT 49.5 11/28/2016   MCV 94.4 11/28/2016   PLT 241 11/28/2016    No results found for: CREATININE  Lab Results  Component Value Date   PSA 2.17 10/20/2015   PSA  12/28/2010    ========== TEST NAME ==========  ========= RESULTS =========  = REFERENCE RANGE =  PROSTATE-SPECIFIC AG.  PSA, Serum (Serial Monitor) Prostate Specific Ag, Serum     [   2.5 ng/mL            ]           0.0-4.0 Roche ECLIA methodology.                                                                      . According to the American Urological Association, Serum PSA should decrease and remain at undetectable levels after radical prostatectomy. The AUA defines biochemical recurrence as an initial PSA value 0.2 ng/mL or greater followed by a subsequent confirmatory PSA value 0.2 ng/mL or greater. Values obtained with different assay methods or kits cannot be used interchangeably. Results cannot be interpreted as absolute evidence of the presence or absence of malignant disease.               [facility]            No: [id number]           [address], [place], [place] [address]           Lindon Romp, MD         [phone]   Result(s) reported on 29 Dec 2010 at 05:17PM.    PSA  09/17/2010    ========== TEST NAME ==========  ========= RESULTS =========  = REFERENCE RANGE =  PROSTATE-SPECIFIC AG.  PSA, Serum (Serial Monitor) Prostate Specific Ag, Serum     [H  9.8 ng/mL            ]           0.0-4.0 Roche ECLIA methodology.                                                                      . According to the American Urological Association, Serum PSA should decrease  and remain at undetectable levels after radical prostatectomy. The AUA defines biochemical recurrence as an initial PSA value 0.2 ng/mL or greater followed by a subsequent confirmatory PSA value 0.2 ng/mL or greater. Values obtained with different assay methods or kits cannot be used interchangeably. Results cannot be interpreted as absolute evidence of the presence or absence of malignant disease.               [facility]            No: [id number]           [address], [place], [place] [address]           Lindon Romp, MD         [phone]   Result(s) reported on 18 Sep 2010 at 12:49PM.     No results found for: TESTOSTERONE  No results found for: HGBA1C  Urinalysis No results found for: COLORURINE, APPEARANCEUR, LABSPEC, PHURINE, GLUCOSEU, HGBUR, BILIRUBINUR, KETONESUR, PROTEINUR, UROBILINOGEN, NITRITE, LEUKOCYTESUR   Assessment & Plan:    1. Prostate cancer  The patient has evidence of biochemical recurrence of prostate cancer. He will need to undergo a CT and bone scan for staging. Pending these results, he will need either Lupron or potentially referred out for cryotherapy if his cancer is localized. He will follow-up to discuss the results.  2. BPH without LUTS -continue flomax 0.4mg  daily   Return for after CT/bone scan.  Nickie Retort, MD  [facility] [address] [place], [place] [address] [phone]

## 2017-02-07 LAB — PSA: Prostate Specific Ag, Serum: 4.1 ng/mL — ABNORMAL HIGH (ref 0.0–4.0)

## 2017-02-28 ENCOUNTER — Inpatient Hospital Stay (HOSPITAL_BASED_OUTPATIENT_CLINIC_OR_DEPARTMENT_OTHER): Payer: Medicare Other | Admitting: Oncology

## 2017-02-28 ENCOUNTER — Inpatient Hospital Stay: Payer: Medicare Other

## 2017-02-28 ENCOUNTER — Encounter: Payer: Self-pay | Admitting: Oncology

## 2017-02-28 ENCOUNTER — Inpatient Hospital Stay: Payer: Medicare Other | Attending: Oncology

## 2017-02-28 VITALS — BP 121/78 | HR 80 | Temp 96.5°F | Resp 18 | Wt 159.4 lb

## 2017-02-28 DIAGNOSIS — D751 Secondary polycythemia: Secondary | ICD-10-CM | POA: Insufficient documentation

## 2017-02-28 DIAGNOSIS — I10 Essential (primary) hypertension: Secondary | ICD-10-CM

## 2017-02-28 DIAGNOSIS — R350 Frequency of micturition: Secondary | ICD-10-CM | POA: Diagnosis not present

## 2017-02-28 DIAGNOSIS — N529 Male erectile dysfunction, unspecified: Secondary | ICD-10-CM

## 2017-02-28 DIAGNOSIS — Z79899 Other long term (current) drug therapy: Secondary | ICD-10-CM | POA: Insufficient documentation

## 2017-02-28 DIAGNOSIS — K219 Gastro-esophageal reflux disease without esophagitis: Secondary | ICD-10-CM | POA: Insufficient documentation

## 2017-02-28 DIAGNOSIS — C751 Malignant neoplasm of pituitary gland: Secondary | ICD-10-CM | POA: Diagnosis not present

## 2017-02-28 DIAGNOSIS — C61 Malignant neoplasm of prostate: Secondary | ICD-10-CM | POA: Diagnosis not present

## 2017-02-28 DIAGNOSIS — E7800 Pure hypercholesterolemia, unspecified: Secondary | ICD-10-CM | POA: Insufficient documentation

## 2017-02-28 DIAGNOSIS — F17210 Nicotine dependence, cigarettes, uncomplicated: Secondary | ICD-10-CM

## 2017-02-28 DIAGNOSIS — Z8042 Family history of malignant neoplasm of prostate: Secondary | ICD-10-CM | POA: Diagnosis not present

## 2017-02-28 LAB — CBC
HCT: 47.3 % (ref 39.0–52.0)
HEMOGLOBIN: 16.9 g/dL (ref 13.0–17.0)
MCH: 33.9 pg (ref 26.0–34.0)
MCHC: 35.8 g/dL (ref 32.0–36.0)
MCV: 94.8 fL (ref 80.0–100.0)
Platelets: 215 10*3/uL (ref 150–440)
RBC: 4.99 MIL/uL (ref 4.40–5.90)
RDW: 14.4 % (ref 11.5–14.5)
WBC: 6.3 10*3/uL (ref 3.8–10.6)

## 2017-02-28 NOTE — Progress Notes (Signed)
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 At that time he had JAK2 as well as exon12 mutation testing done which was negative. EPO was normal at 5.9. Patient has not had a bone marrow biopsy. CT abdomen back in 2011 did not reveal any evidence of intra-abdominal malignancy that could becontributing to polycythemia. Patient remained asymptomatic but has been requiring intermittent phlebotomy when his hematocrit is more than 60. Last phlebotomy was in December 2017   Interval history- doing well. Denies any complaints  ECOG PS- 1 Pain scale- 0   Review of systems- Review of Systems  Constitutional: Negative for chills, fever, malaise/fatigue and weight loss.  HENT: Negative for congestion, ear discharge and nosebleeds.   Eyes: Negative for blurred vision.  Respiratory: Negative for cough, hemoptysis, sputum production, shortness of breath and wheezing.   Cardiovascular: Negative for chest pain, palpitations, orthopnea and claudication.  Gastrointestinal: Negative for abdominal pain, blood in stool, constipation, diarrhea, heartburn, melena, nausea and vomiting.  Genitourinary: Negative for dysuria, flank pain, frequency, hematuria and urgency.  Musculoskeletal: Negative for back pain, joint pain and myalgias.  Skin: Negative for rash.  Neurological: Negative for dizziness, tingling, focal weakness, seizures, weakness and headaches.  Endo/Heme/Allergies: Does not bruise/bleed easily.  Psychiatric/Behavioral: Negative for depression  and suicidal ideas. The patient does not have insomnia.       No Known Allergies   Past Medical History:  Diagnosis Date  . BPH (benign prostatic hyperplasia)   . GERD (gastroesophageal reflux disease)   . Hypercholesteremia   . Hypertension   . Male erectile dysfunction   . Microscopic hematuria   . Polycythemia   . Prostate cancer ([place]) 2008   Gleeson 8, seed implant in 2011  . Urinary frequency      Past Surgical History:  Procedure Laterality Date  . CYSTOSCOPY  03/2013   by Dr. Bernardo Heater  . TRANSURETHRAL RESECTION OF PROSTATE  2015    Social History   Social History  . Marital status: Married    Spouse name: N/A  . Number of children: N/A  . Years of education: N/A   Occupational History  . Not on file.   Social History Main Topics  . Smoking status: Current Every Day Smoker    Packs/day: 0.25    Years: 51.00    Types: Cigarettes  . Smokeless tobacco: Never Used  . Alcohol use No  . Drug use: No  . Sexual activity: No   Other Topics Concern  . Not on file   Social History Narrative  . No narrative on file    Family History  Problem Relation Age of Onset  . Prostate cancer Brother      Current Outpatient Prescriptions:  .  amLODipine (NORVASC) 5 MG tablet, Take 5 mg by mouth daily., Disp: , Rfl:  .  simvastatin (ZOCOR) 20 MG tablet, Take 20 mg by mouth daily., Disp: , Rfl:  .  tamsulosin (FLOMAX) 0.4 MG CAPS capsule, Take 4 mg by mouth daily., Disp: , Rfl:   Physical exam:  Vitals:   02/28/17 1029  BP: 121/78  Pulse: 80  Resp: 18  Temp: (!) 96.5 F (35.8 C)  TempSrc: Tympanic  Weight: 159 lb 7 oz (72.3 kg)   Physical Exam  Constitutional: He is oriented to person, place, and time and well-developed, well-nourished, and in no distress.  HENT:  Head: Normocephalic and atraumatic.  Eyes: EOM are normal. Pupils are equal, round, and reactive to light.  Neck: Normal range of motion.  Cardiovascular: Normal rate, regular rhythm and  normal heart sounds.   Pulmonary/Chest: Effort normal and breath sounds normal.  Abdominal: Soft. Bowel sounds are normal.  Neurological: He is alert and oriented to person, place, and time.  Skin: Skin is warm and dry.     No flowsheet data found. CBC Latest Ref Rng & Units 02/28/2017  WBC 3.8 - 10.6 K/uL 6.3  Hemoglobin 13.0 - 18.0 g/dL 16.9  Hematocrit 40.0 - 52.0 % 47.3  Platelets 150 - 440 K/uL 215      Assessment and plan- Patient is a 76 y.o. 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 No need for phlebotomy today. I again encouraged him to quit smoking and he is not inerested. Repeat cbc in 3 months and he will see me in 6 months for possible phlebotomy  He also has a h/o prostate cancer now with biochemical recurrnce. This is being managed by Dr. Pilar Jarvis   Visit Diagnosis 1. Prostate cancer ([place])   2. Polycythemia secondary to smoking      Dr. Randa Evens, MD, MPH [facility] at [facility] Pager- [phone] 02/28/2017 2:12 PM

## 2017-02-28 NOTE — Progress Notes (Signed)
Patient offers no complaints today. 

## 2017-03-04 ENCOUNTER — Telehealth: Payer: Self-pay

## 2017-03-04 NOTE — Telephone Encounter (Signed)
Spoke to daughter. Gave imaging scheduling and instructions. Gave OV follow-up appt also. Daughter verbalized understanding.

## 2017-03-04 NOTE — Telephone Encounter (Signed)
Daughter and spouse called in for results. Went over last OV plan and assessment. Daughter states they were not called for CT and bone scan scheduling. Escalated call to [facility] to contact radiology for scheduling. Ok to schedule for any morning on any day but Tue.  Canceled upcoming OV appt to discuss results on  07/05.Daughter and spouse verbalized understanding.

## 2017-03-06 ENCOUNTER — Ambulatory Visit: Payer: Medicare Other

## 2017-03-13 ENCOUNTER — Ambulatory Visit
Admission: RE | Admit: 2017-03-13 | Discharge: 2017-03-13 | Disposition: A | Discharge status: Home / Self Care | Payer: Medicare Other | Source: Ambulatory Visit | Attending: Urology | Admitting: Urology

## 2017-03-13 ENCOUNTER — Encounter
Admission: RE | Admit: 2017-03-13 | Discharge: 2017-03-13 | Disposition: A | Discharge status: Home / Self Care | Payer: Medicare Other | Source: Ambulatory Visit | Attending: Urology | Admitting: Urology

## 2017-03-13 ENCOUNTER — Ambulatory Visit: Admission: RE | Admit: 2017-03-13 | Payer: Medicare Other | Source: Ambulatory Visit

## 2017-03-13 DIAGNOSIS — C61 Malignant neoplasm of prostate: Secondary | ICD-10-CM | POA: Insufficient documentation

## 2017-03-13 DIAGNOSIS — I714 Abdominal aortic aneurysm, without rupture: Secondary | ICD-10-CM | POA: Insufficient documentation

## 2017-03-13 LAB — POCT I-STAT CREATININE: CREATININE: 0.8 mg/dL (ref 0.61–1.24)

## 2017-03-13 MED ORDER — IOPAMIDOL (ISOVUE-300) INJECTION 61%
100.0000 mL | Freq: Once | INTRAVENOUS | Status: AC | PRN
  Administered 2017-03-13: 100 mL via INTRAVENOUS

## 2017-03-13 MED ORDER — TECHNETIUM TC 99M MEDRONATE IV KIT
25.0000 | PACK | Freq: Once | INTRAVENOUS | Status: AC | PRN
  Administered 2017-03-13: 24.17 via INTRAVENOUS

## 2017-03-20 ENCOUNTER — Encounter: Payer: Self-pay | Admitting: Urology

## 2017-03-20 ENCOUNTER — Ambulatory Visit (INDEPENDENT_AMBULATORY_CARE_PROVIDER_SITE_OTHER): Payer: Medicare Other | Admitting: Urology

## 2017-03-20 VITALS — BP 103/57 | HR 87 | Ht 73.0 in | Wt 159.0 lb

## 2017-03-20 DIAGNOSIS — C61 Malignant neoplasm of prostate: Secondary | ICD-10-CM | POA: Diagnosis not present

## 2017-03-20 NOTE — Progress Notes (Signed)
03/20/2017 10:13 AM   America Brown Jun 27, 1941 [id number]  Referring provider: Madelyn Brunner, MD [facility] [place], [place] [address]  Chief Complaint  Patient presents with  . Prostate Cancer    Bone scan results    HPI: Mr Plant is a 76yo seen today for BPh with LUTS and prostate cancer. He received brachytherapy for prostate cancer in 2011. PSA and pathology reports are not available to me. He underwent TURP in 2014 for BPH and was found to prostate cancer in the specimen.  Pathology was Gleason 3+3=6 in 5% of specimen. He elected to undergo surveillance that time. PSA at that time was 0.6. Most recent PSA in 2.17 in 10/2015. PSA in December 2017 was 3.2. He is currently on flomax for his BPH. He denies any significant LUTS.  DRE benign 12/17. He underwent a CT and bone scan for his biochemical recurrence. These were both negative.  The patient is a very poor historian and does not remember undergoing these procedures. His wife however does recall these procedures.     PMH: Past Medical History:  Diagnosis Date  . BPH (benign prostatic hyperplasia)   . GERD (gastroesophageal reflux disease)   . Hypercholesteremia   . Hypertension   . Male erectile dysfunction   . Microscopic hematuria   . Polycythemia   . Prostate cancer ([place]) 2008   Gleeson 8, seed implant in 2011  . Urinary frequency     Surgical History: Past Surgical History:  Procedure Laterality Date  . CYSTOSCOPY  03/2013   by Dr. Bernardo Heater  . TRANSURETHRAL RESECTION OF PROSTATE  2015    Home Medications:  Allergies as of 03/20/2017   No Known Allergies     Medication List       Accurate as of 03/20/17 10:13 AM. Always use your most recent med list.          amLODipine 5 MG tablet Commonly known as:  NORVASC Take 5 mg by mouth daily.   simvastatin 20 MG tablet Commonly known as:  ZOCOR Take 20 mg by mouth daily.   tamsulosin 0.4 MG Caps capsule Commonly  known as:  FLOMAX Take 4 mg by mouth daily.       Allergies: No Known Allergies  Family History: Family History  Problem Relation Age of Onset  . Prostate cancer Brother     Social History:  reports that he has been smoking Cigarettes.  He has a 12.75 pack-year smoking history. He has never used smokeless tobacco. He reports that he does not drink alcohol or use drugs.  ROS: UROLOGY Frequent Urination?: No Hard to postpone urination?: No Burning/pain with urination?: No Get up at night to urinate?: No Leakage of urine?: No Urine stream starts and stops?: No Trouble starting stream?: No Do you have to strain to urinate?: No Blood in urine?: No Urinary tract infection?: No Sexually transmitted disease?: No Injury to kidneys or bladder?: No Painful intercourse?: No Weak stream?: No Erection problems?: No Penile pain?: No  Gastrointestinal Nausea?: No Vomiting?: No Indigestion/heartburn?: No Diarrhea?: No Constipation?: No  Constitutional Fever: No Night sweats?: No Weight loss?: No Fatigue?: No  Skin Skin rash/lesions?: No Itching?: No  Eyes Blurred vision?: No Double vision?: No  Ears/Nose/Throat Sore throat?: No Sinus problems?: No  Hematologic/Lymphatic Swollen glands?: No Easy bruising?: No  Cardiovascular Leg swelling?: No Chest pain?: No  Respiratory Cough?: No Shortness of breath?: No  Endocrine Excessive thirst?: No  Musculoskeletal  Back pain?: No Joint pain?: No  Neurological Headaches?: No Dizziness?: No  Psychologic Depression?: No Anxiety?: No  Physical Exam: BP (!) 103/57 (BP Location: Left Arm, Patient Position: Sitting, Cuff Size: Normal)   Pulse 87   Ht 6\' 1"  (1.854 m)   Wt 159 lb (72.1 kg)   BMI 20.98 kg/m   Constitutional:  Alert and oriented, No acute distress. HEENT: [place] AT, moist mucus membranes.  Trachea midline, no masses. Cardiovascular: No clubbing, cyanosis, or edema. Respiratory: Normal respiratory  effort, no increased work of breathing. GI: Abdomen is soft, nontender, nondistended, no abdominal masses GU: No CVA tenderness.  Skin: No rashes, bruises or suspicious lesions. Lymph: No cervical or inguinal adenopathy. Neurologic: Grossly intact, no focal deficits, moving all 4 extremities. Psychiatric: Normal mood and affect.  Laboratory Data: Lab Results  Component Value Date   WBC 6.3 02/28/2017   HGB 16.9 02/28/2017   HCT 47.3 02/28/2017   MCV 94.8 02/28/2017   PLT 215 02/28/2017    Lab Results  Component Value Date   CREATININE 0.80 03/13/2017    Lab Results  Component Value Date   PSA 2.17 10/20/2015   PSA  12/28/2010    ========== TEST NAME ==========  ========= RESULTS =========  = REFERENCE RANGE =  PROSTATE-SPECIFIC AG.  PSA, Serum (Serial Monitor) Prostate Specific Ag, Serum     [   2.5 ng/mL            ]           0.0-4.0 Roche ECLIA methodology.                                                                      . According to the American Urological Association, Serum PSA should decrease and remain at undetectable levels after radical prostatectomy. The AUA defines biochemical recurrence as an initial PSA value 0.2 ng/mL or greater followed by a subsequent confirmatory PSA value 0.2 ng/mL or greater. Values obtained with different assay methods or kits cannot be used interchangeably. Results cannot be interpreted as absolute evidence of the presence or absence of malignant disease.               [facility]            No: [id number]           [address], [place], [place] [address]           Lindon Romp, MD         [phone]   Result(s) reported on 29 Dec 2010 at 05:17PM.    PSA  09/17/2010    ========== TEST NAME ==========  ========= RESULTS =========  = REFERENCE RANGE =  PROSTATE-SPECIFIC AG.  PSA, Serum (Serial Monitor) Prostate Specific Ag, Serum     [H  9.8 ng/mL            ]           0.0-4.0 Roche ECLIA  methodology.                                                                      Marland Kitchen  According to the American Urological Association, Serum PSA should decrease and remain at undetectable levels after radical prostatectomy. The AUA defines biochemical recurrence as an initial PSA value 0.2 ng/mL or greater followed by a subsequent confirmatory PSA value 0.2 ng/mL or greater. Values obtained with different assay methods or kits cannot be used interchangeably. Results cannot be interpreted as absolute evidence of the presence or absence of malignant disease.               [facility]  [place]            No: [id number]           [address], [place], [place] [address]           Lindon Romp, MD         [phone]   Result(s) reported on 18 Sep 2010 at 12:49PM.     No results found for: TESTOSTERONE  No results found for: HGBA1C  Urinalysis No results found for: COLORURINE, APPEARANCEUR, LABSPEC, [place], GLUCOSEU, HGBUR, BILIRUBINUR, KETONESUR, PROTEINUR, UROBILINOGEN, NITRITE, LEUKOCYTESUR  Pertinent Imaging: CT and bone scan reviewed as above per  Assessment & Plan:   1. Biochemical recurrence of prostate cancer I had a long discussion of the patient's wife regarding his nonmetastatic biochemical recurrence of prostate cancer. We discussed treatment options including androgen deprivation therapy. I strongly recommend the patient consider starting this therapy. He like to go home and think about it and follow-up in a few weeks for his first shot. Again this is reasonable. He does understand the risks include but are not limited to fatigue, decreased muscle mass, osteoporosis, decreased sex drive, erectile dysfunction. He understands that he will need to take calcium and vitamin D to minimize the risk of severe osteoporosis. All questions were answered. The patient will follow-up in a few weeks for Lupron injection after he is had more time thinking about proceeding with this  step.   There are no diagnoses linked to this encounter.  No Follow-up on file.  Nickie Retort, MD  [facility] [address] [place], [place] [address] [phone]

## 2017-04-10 ENCOUNTER — Encounter: Payer: Self-pay | Admitting: Urology

## 2017-04-10 ENCOUNTER — Ambulatory Visit: Payer: Medicare Other | Admitting: Urology

## 2017-04-10 VITALS — BP 103/68 | HR 94 | Ht 73.0 in | Wt 156.4 lb

## 2017-04-10 DIAGNOSIS — C61 Malignant neoplasm of prostate: Secondary | ICD-10-CM

## 2017-04-10 MED ORDER — LEUPROLIDE ACETATE (3 MONTH) 22.5 MG IM KIT
22.5000 mg | PACK | Freq: Once | INTRAMUSCULAR | Status: AC
  Administered 2017-04-10: 22.5 mg via INTRAMUSCULAR

## 2017-04-10 NOTE — Progress Notes (Signed)
Lupron IM Injection   Due to Prostate Cancer patient is present today for a Lupron Injection.  Medication: Lupron 3 month Dose: 22.5 mg  Location: left upper outer buttocks Lot: [id number] Exp: 07/19/2019  Patient tolerated well, no complications were noted.  Performed by: Lyndee Hensen CMA  Follow up: 3 months

## 2017-04-10 NOTE — Progress Notes (Signed)
04/10/2017 10:20 AM   Jerry Haynes 12/18/1940 [id number]  Referring provider: Madelyn Brunner, MD [facility] [place], [place] [address]  Chief Complaint  Patient presents with  . Prostate Cancer    discuss lupron injection    HPI: Jerry Haynes is a 76yo seen today for BPh with LUTS and prostate cancer. He received brachytherapy for prostate cancer in 2011. PSA and pathology reports are not available to me. He underwent TURP in 2014 for BPH and was found to prostate cancer in the specimen. Pathology was Gleason 3+3=6 in 5% of specimen. He elected to undergo surveillance that time.PSA at that time was 0.6. PSA in 2.17 in 10/2015. PSA in December 2017 was 3.2. PSA rose to 4.1 in June 2018. He is currently on flomax for his BPH. He denies any significant LUTS. DRE benign 12/17. He underwent a CT and bone scan for his biochemical recurrence. These were both negative. He was counseled to undergo androgen deprivation therapy for biochemical recurrence of prostate cancer at his last visit. He and his wife wanted time to discuss this further before starting any treatment. He returns today for further discussion of Lupron therapy. The patient and his wife have elected to start ADT.  The patient is a very poor historian. His wife provides much of the history.   PMH: Past Medical History:  Diagnosis Date  . BPH (benign prostatic hyperplasia)   . GERD (gastroesophageal reflux disease)   . Hypercholesteremia   . Hypertension   . Male erectile dysfunction   . Microscopic hematuria   . Polycythemia   . Prostate cancer ([place]) 2008   Gleeson 8, seed implant in 2011  . Urinary frequency     Surgical History: Past Surgical History:  Procedure Laterality Date  . CYSTOSCOPY  03/2013   by Dr. Bernardo Heater  . TRANSURETHRAL RESECTION OF PROSTATE  2015    Home Medications:  Allergies as of 04/10/2017   No Known Allergies     Medication List       Accurate as of  04/10/17 10:20 AM. Always use your most recent med list.          amLODipine 5 MG tablet Commonly known as:  NORVASC Take 5 mg by mouth daily.   simvastatin 20 MG tablet Commonly known as:  ZOCOR Take 20 mg by mouth daily.   tamsulosin 0.4 MG Caps capsule Commonly known as:  FLOMAX Take 4 mg by mouth daily.       Allergies: No Known Allergies  Family History: Family History  Problem Relation Age of Onset  . Prostate cancer Brother   . Kidney cancer Neg Hx   . Bladder Cancer Neg Hx     Social History:  reports that he has been smoking Cigarettes.  He has a 12.75 pack-year smoking history. He has never used smokeless tobacco. He reports that he does not drink alcohol or use drugs.  ROS: UROLOGY Frequent Urination?: No Hard to postpone urination?: No Burning/pain with urination?: No Get up at night to urinate?: No Leakage of urine?: No Urine stream starts and stops?: No Trouble starting stream?: No Do you have to strain to urinate?: No Blood in urine?: No Urinary tract infection?: No Sexually transmitted disease?: No Injury to kidneys or bladder?: No Painful intercourse?: No Weak stream?: No Erection problems?: No Penile pain?: No  Gastrointestinal Nausea?: No Vomiting?: No Indigestion/heartburn?: No Diarrhea?: No Constipation?: No  Constitutional Fever: No Night sweats?: No Weight  loss?: No Fatigue?: No  Skin Skin rash/lesions?: No Itching?: No  Eyes Blurred vision?: Yes Double vision?: No  Ears/Nose/Throat Sore throat?: No Sinus problems?: No  Hematologic/Lymphatic Swollen glands?: No Easy bruising?: No  Cardiovascular Leg swelling?: No Chest pain?: No  Respiratory Cough?: No Shortness of breath?: No  Endocrine Excessive thirst?: No  Musculoskeletal Back pain?: No Joint pain?: Yes  Neurological Headaches?: No Dizziness?: No  Psychologic Depression?: No Anxiety?: No  Physical Exam: BP 103/68   Pulse 94   Ht 6\' 1"   (1.854 m)   Wt 156 lb 6.4 oz (70.9 kg)   BMI 20.63 kg/m   Constitutional:  Alert and oriented, No acute distress. HEENT: [place] AT, moist mucus membranes.  Trachea midline, no masses. Cardiovascular: No clubbing, cyanosis, or edema. Respiratory: Normal respiratory effort, no increased work of breathing. GI: Abdomen is soft, nontender, nondistended, no abdominal masses GU: No CVA tenderness.  Skin: No rashes, bruises or suspicious lesions. Lymph: No cervical or inguinal adenopathy. Neurologic: Grossly intact, no focal deficits, moving all 4 extremities. Psychiatric: Normal mood and affect.  Laboratory Data: Lab Results  Component Value Date   WBC 6.3 02/28/2017   HGB 16.9 02/28/2017   HCT 47.3 02/28/2017   MCV 94.8 02/28/2017   PLT 215 02/28/2017    Lab Results  Component Value Date   CREATININE 0.80 03/13/2017    Lab Results  Component Value Date   PSA 2.17 10/20/2015   PSA  12/28/2010    ========== TEST NAME ==========  ========= RESULTS =========  = REFERENCE RANGE =  PROSTATE-SPECIFIC AG.  PSA, Serum (Serial Monitor) Prostate Specific Ag, Serum     [   2.5 ng/mL            ]           0.0-4.0 Roche ECLIA methodology.                                                                      . According to the American Urological Association, Serum PSA should decrease and remain at undetectable levels after radical prostatectomy. The AUA defines biochemical recurrence as an initial PSA value 0.2 ng/mL or greater followed by a subsequent confirmatory PSA value 0.2 ng/mL or greater. Values obtained with different assay methods or kits cannot be used interchangeably. Results cannot be interpreted as absolute evidence of the presence or absence of malignant disease.               [facility]            No: [id number]           [address], [place], [place] [address]           Lindon Romp, MD         [phone]   Result(s) reported on 29 Dec 2010 at  05:17PM.    PSA  09/17/2010    ========== TEST NAME ==========  ========= RESULTS =========  = REFERENCE RANGE =  PROSTATE-SPECIFIC AG.  PSA, Serum (Serial Monitor) Prostate Specific Ag, Serum     [H  9.8 ng/mL            ]           0.0-4.0 Roche  ECLIA methodology.                                                                      . According to the American Urological Association, Serum PSA should decrease and remain at undetectable levels after radical prostatectomy. The AUA defines biochemical recurrence as an initial PSA value 0.2 ng/mL or greater followed by a subsequent confirmatory PSA value 0.2 ng/mL or greater. Values obtained with different assay methods or kits cannot be used interchangeably. Results cannot be interpreted as absolute evidence of the presence or absence of malignant disease.               [facility]             No: [id number]           [address] [place], [place] [address]           Lindon Romp, MD         [id number]   Result(s) reported on 18 Sep 2010 at 12:49PM.     No results found for: TESTOSTERONE  No results found for: HGBA1C  Urinalysis No results found for: COLORURINE, APPEARANCEUR, LABSPEC, PHURINE, GLUCOSEU, HGBUR, BILIRUBINUR, KETONESUR, PROTEINUR, UROBILINOGEN, NITRITE, LEUKOCYTESUR   Assessment & Plan:    1. 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 At this time they're agreeable to proceeding. We did again review the side effects which include fatigue, weakness, osteoporosis, decreased energy, decreased libido, and erectile dysfunction. He was instructed to initiate vitamin D and calcium therapy at this time. He was given a 3 month dose of Lupron. He'll follow-up in 3 months for repeat injection with a PSA and testosterone prior. Depending on his response and  ability to tolerate the medication, he may be able to try a 6 month dose with 6 month follow-up after his next visit.  Return in about 3 months (around 07/11/2017) for repeat lupron. PSA/T prior.  Nickie Retort, MD  [facility] [address] [place], [place] [address] [phone]

## 2017-04-21 DIAGNOSIS — R413 Other amnesia: Secondary | ICD-10-CM | POA: Insufficient documentation

## 2017-06-02 ENCOUNTER — Other Ambulatory Visit: Payer: Self-pay | Admitting: *Deleted

## 2017-06-02 ENCOUNTER — Other Ambulatory Visit: Payer: Self-pay | Admitting: Oncology

## 2017-06-02 ENCOUNTER — Inpatient Hospital Stay: Payer: Medicare Other | Attending: Oncology

## 2017-06-02 ENCOUNTER — Telehealth: Payer: Self-pay | Admitting: *Deleted

## 2017-06-02 DIAGNOSIS — C61 Malignant neoplasm of prostate: Secondary | ICD-10-CM | POA: Diagnosis not present

## 2017-06-02 DIAGNOSIS — C751 Malignant neoplasm of pituitary gland: Secondary | ICD-10-CM | POA: Diagnosis present

## 2017-06-02 DIAGNOSIS — F17210 Nicotine dependence, cigarettes, uncomplicated: Secondary | ICD-10-CM | POA: Insufficient documentation

## 2017-06-02 DIAGNOSIS — D751 Secondary polycythemia: Secondary | ICD-10-CM

## 2017-06-02 LAB — CBC
HEMATOCRIT: 53 % — AB (ref 40.0–52.0)
Hemoglobin: 18.6 g/dL — ABNORMAL HIGH (ref 13.0–18.0)
MCH: 33.6 pg (ref 26.0–34.0)
MCHC: 35.1 g/dL (ref 32.0–36.0)
MCV: 95.7 fL (ref 80.0–100.0)
PLATELETS: 214 10*3/uL (ref 150–440)
RBC: 5.54 MIL/uL (ref 4.40–5.90)
RDW: 13.8 % (ref 11.5–14.5)
WBC: 5.8 10*3/uL (ref 3.8–10.6)

## 2017-06-02 NOTE — Telephone Encounter (Signed)
Called pt's home and no answer. Called daughter and spoke to her and she said that she will call him and call me back and she did. He is agreeable to the phlebotomy every 2 weeks. Starting this wed.  I told her when they check in on wed. Just to ask for envelope and I will put his schedule with his name on it so they will have all the rest of the appt for the next 4 times. Daughter knows to bring him 10/3 at 66 am and this time no blood work because he had the blood work today

## 2017-06-04 ENCOUNTER — Inpatient Hospital Stay: Payer: Medicare Other

## 2017-06-04 ENCOUNTER — Other Ambulatory Visit: Payer: Self-pay | Admitting: Oncology

## 2017-06-04 NOTE — Progress Notes (Signed)
Hold phlebotomy today per Dr. Grayland Ormond.  Patient will have labs done next week.

## 2017-06-18 ENCOUNTER — Inpatient Hospital Stay: Payer: Medicare Other

## 2017-06-30 ENCOUNTER — Other Ambulatory Visit: Payer: Self-pay | Admitting: Oncology

## 2017-06-30 ENCOUNTER — Telehealth: Payer: Self-pay | Admitting: *Deleted

## 2017-06-30 NOTE — Telephone Encounter (Signed)
Daughter called and said that her Dad has 2 lab appts on same day 10/31. One at [facility] and one at cancer center. Wanted to know if labs could be combined. I checked with [facility]- and they are drawing cbc/d, met c, lipid panel and ua.  Patient can have labs done there because all labs for Jerry Haynes was cbc.  I did tell daughter that based on his cbc he may need phelbotomy and pt does not want to come to 2 places on same day.  I told her that I can wait and see what labs re for 10/31 and call her to arrange a phlebotomy if needed and she is agreeable to that.  It would have to be scheduled on another date. Will make a note to check level for 10/31

## 2017-06-30 NOTE — Telephone Encounter (Signed)
-----   Message from Elouise Munroe sent at 06/30/2017 10:44 AM EDT ----- Regarding: appt?? Daughter called and stated pt has lab draw Dr Candie Chroman early AM on 10-31 and he has draw here too later AM- Since he has it with Dr Gilford Rile does he need here too same day?

## 2017-07-02 ENCOUNTER — Inpatient Hospital Stay: Payer: Medicare Other

## 2017-07-14 ENCOUNTER — Other Ambulatory Visit: Payer: Medicare Other

## 2017-07-14 ENCOUNTER — Telehealth: Payer: Self-pay | Admitting: *Deleted

## 2017-07-14 DIAGNOSIS — C61 Malignant neoplasm of prostate: Secondary | ICD-10-CM

## 2017-07-14 NOTE — Telephone Encounter (Signed)
ddaughter had called about why another lab this week. I called back to say that the labs the pt had today were about his prostate and it did not have a cbc where we check his hgb and hct.  10/31 I reviewed the labs from [facility] and it ws 55 hct and dr Janese Banks said because his polycythemia sec. Then he could wait til about 60.  So he did not need one 10/31/ but his next check is 2 weeks later which is 11/14.  If they can't make it then they need to call and let  Me know.

## 2017-07-14 NOTE — Telephone Encounter (Signed)
-----   Message from Sindy Guadeloupe, MD sent at 07/14/2017  3:21 PM EST ----- Regarding: RE: appt for labs Contact: [phone] He didn't get cbc done on recent labs. anyways he can get cbc done next month instead of wednesday ----- Message ----- From: Secundino Ginger Sent: 07/14/2017   3:04 PM To: Luella Cook, RN, Sindy Guadeloupe, MD Subject: appt for labs                                  Pt had labs drawn recently and wants to know why he has to have labs drawn again on Wednesday?

## 2017-07-15 LAB — TESTOSTERONE

## 2017-07-15 LAB — PSA: PROSTATE SPECIFIC AG, SERUM: 0.7 ng/mL (ref 0.0–4.0)

## 2017-07-16 ENCOUNTER — Inpatient Hospital Stay: Payer: Medicare Other

## 2017-07-18 ENCOUNTER — Inpatient Hospital Stay: Payer: Medicare Other

## 2017-07-18 ENCOUNTER — Inpatient Hospital Stay: Payer: Medicare Other | Attending: Oncology

## 2017-07-18 ENCOUNTER — Encounter: Payer: Self-pay | Admitting: Urology

## 2017-07-18 ENCOUNTER — Ambulatory Visit: Payer: Medicare Other | Admitting: Urology

## 2017-07-18 VITALS — BP 144/86 | HR 83 | Ht 73.0 in | Wt 150.1 lb

## 2017-07-18 DIAGNOSIS — Z8546 Personal history of malignant neoplasm of prostate: Secondary | ICD-10-CM | POA: Diagnosis not present

## 2017-07-18 DIAGNOSIS — C61 Malignant neoplasm of prostate: Secondary | ICD-10-CM

## 2017-07-18 DIAGNOSIS — Z923 Personal history of irradiation: Secondary | ICD-10-CM | POA: Diagnosis not present

## 2017-07-18 DIAGNOSIS — D751 Secondary polycythemia: Secondary | ICD-10-CM

## 2017-07-18 DIAGNOSIS — F17210 Nicotine dependence, cigarettes, uncomplicated: Secondary | ICD-10-CM | POA: Insufficient documentation

## 2017-07-18 LAB — CBC
HEMATOCRIT: 51.9 % (ref 40.0–52.0)
HEMOGLOBIN: 17.8 g/dL (ref 13.0–18.0)
MCH: 32.8 pg (ref 26.0–34.0)
MCHC: 34.2 g/dL (ref 32.0–36.0)
MCV: 95.8 fL (ref 80.0–100.0)
Platelets: 226 10*3/uL (ref 150–440)
RBC: 5.42 MIL/uL (ref 4.40–5.90)
RDW: 13.5 % (ref 11.5–14.5)
WBC: 6 10*3/uL (ref 3.8–10.6)

## 2017-07-18 MED ORDER — LEUPROLIDE ACETATE (6 MONTH) 45 MG IM KIT
45.0000 mg | PACK | Freq: Once | INTRAMUSCULAR | Status: AC
  Administered 2017-07-18: 45 mg via INTRAMUSCULAR

## 2017-07-18 NOTE — Progress Notes (Signed)
07/18/2017 10:23 AM   Jerry Haynes [birth year] [id number]  Referring provider: Madelyn Brunner, MD [facility] [place], [place] [address]  Chief Complaint  Patient presents with  . Prostate Cancer    HPI: Jerry Haynes is a 76yo seen today for BPh with LUTS and prostate cancer. He received brachytherapy for prostate cancer in 2011. PSA and pathology reports are not available to me. He underwent TURP in 2014 for BPH and was found to prostate cancer in the specimen. Pathology was Gleason 3+3=6 in 5% of specimen. He elected to undergo surveillance that time.PSA at that time was 0.6. PSA in 2.17 in 10/2015. PSA in December 2017 was 3.2. PSA rose to 4.1 in June 2018. He is currently on flomax for his BPH. He denies any significant LUTS. DRE benign 12/17. He underwent a CT and bone scan for his biochemical recurrence. These were both negative. He was started androgen deprivation therapy for biochemical recurrence of prostate cancer at his last visit on April 10, 2017.  His PSA decreased to 0.7 in November 2018.  His testosterone is less than 3.  He is tolerating enteral deprivation therapy well.  No hot flashes or complaints of weakness.  He feels the same as he did prior to starting the therapy.  The patient is a very poor historian. His wife provides much of the history.     PMH: Past Medical History:  Diagnosis Date  . BPH (benign prostatic hyperplasia)   . GERD (gastroesophageal reflux disease)   . Hypercholesteremia   . Hypertension   . Male erectile dysfunction   . Microscopic hematuria   . Polycythemia   . Prostate cancer ([place]) 2008   Gleeson 8, seed implant in 2011  . Urinary frequency     Surgical History: Past Surgical History:  Procedure Laterality Date  . CYSTOSCOPY  03/2013   by Dr. Bernardo Heater  . TRANSURETHRAL RESECTION OF PROSTATE  2015    Home Medications:  Allergies as of 07/18/2017   No Known Allergies     Medication List        Accurate as of 07/18/17 10:23 AM. Always use your most recent med list.          amLODipine 5 MG tablet Commonly known as:  NORVASC Take 5 mg by mouth daily.   donepezil 5 MG tablet Commonly known as:  ARICEPT   simvastatin 20 MG tablet Commonly known as:  ZOCOR Take 20 mg by mouth daily.   tamsulosin 0.4 MG Caps capsule Commonly known as:  FLOMAX Take 4 mg by mouth daily.       Allergies: No Known Allergies  Family History: Family History  Problem Relation Age of Onset  . Prostate cancer Brother   . Kidney cancer Neg Hx   . Bladder Cancer Neg Hx     Social History:  reports that he has been smoking cigarettes.  He has a 12.75 pack-year smoking history. he has never used smokeless tobacco. He reports that he does not drink alcohol or use drugs.  ROS: UROLOGY Frequent Urination?: No Hard to postpone urination?: No Burning/pain with urination?: No Get up at night to urinate?: No Leakage of urine?: No Urine stream starts and stops?: No Trouble starting stream?: No Do you have to strain to urinate?: No Blood in urine?: No Urinary tract infection?: No Sexually transmitted disease?: No Injury to kidneys or bladder?: No Painful intercourse?: No Weak stream?: No Erection problems?: No Penile pain?:  No  Gastrointestinal Nausea?: No Vomiting?: No Indigestion/heartburn?: No Diarrhea?: No Constipation?: No  Constitutional Fever: No Night sweats?: No Weight loss?: No Fatigue?: No  Skin Skin rash/lesions?: No Itching?: No  Eyes Blurred vision?: No Double vision?: No  Ears/Nose/Throat Sore throat?: No Sinus problems?: No  Hematologic/Lymphatic Swollen glands?: No Easy bruising?: No  Cardiovascular Leg swelling?: No Chest pain?: No  Respiratory Cough?: No Shortness of breath?: No  Endocrine Excessive thirst?: No  Musculoskeletal Back pain?: No Joint pain?: No  Neurological Headaches?: No Dizziness?:  No  Psychologic Depression?: No Anxiety?: No  Physical Exam: BP (!) 144/86 (BP Location: Right Arm, Patient Position: Sitting, Cuff Size: Normal)   Pulse 83   Ht 6\' 1"  (1.854 m)   Wt 150 lb 1.6 oz (68.1 kg)   BMI 19.80 kg/m   Constitutional:  Alert and oriented, No acute distress. HEENT: [place] AT, moist mucus membranes.  Trachea midline, no masses. Cardiovascular: No clubbing, cyanosis, or edema. Respiratory: Normal respiratory effort, no increased work of breathing. GI: Abdomen is soft, nontender, nondistended, no abdominal masses GU: No CVA tenderness.  Skin: No rashes, bruises or suspicious lesions. Lymph: No cervical or inguinal adenopathy. Neurologic: Grossly intact, no focal deficits, moving all 4 extremities. Psychiatric: Normal mood and affect.  Laboratory Data: Lab Results  Component Value Date   WBC 5.8 06/02/2017   HGB 18.6 (H) 06/02/2017   HCT 53.0 (H) 06/02/2017   MCV 95.7 06/02/2017   PLT 214 06/02/2017    Lab Results  Component Value Date   CREATININE 0.80 03/13/2017    Lab Results  Component Value Date   PSA 2.17 10/20/2015   PSA  12/28/2010    ========== TEST NAME ==========  ========= RESULTS =========  = REFERENCE RANGE =  PROSTATE-SPECIFIC AG.  PSA, Serum (Serial Monitor) Prostate Specific Ag, Serum     [   2.5 ng/mL            ]           0.0-4.0 Roche ECLIA methodology.                                                                      . According to the American Urological Association, Serum PSA should decrease and remain at undetectable levels after radical prostatectomy. The AUA defines biochemical recurrence as an initial PSA value 0.2 ng/mL or greater followed by a subsequent confirmatory PSA value 0.2 ng/mL or greater. Values obtained with different assay methods or kits cannot be used interchangeably. Results cannot be interpreted as absolute evidence of the presence or absence of malignant disease.               [facility]  [place]            No: [id number]           [address], [place], [place] [address]           Lindon Romp, MD         [phone]   Result(s) reported on 29 Dec 2010 at 05:17PM.    PSA  09/17/2010    ========== TEST NAME ==========  ========= RESULTS =========  = REFERENCE RANGE =  PROSTATE-SPECIFIC AG.  PSA, Serum (Serial Monitor) Prostate Specific  Ag, Serum     [H  9.8 ng/mL            ]           0.0-4.0 Roche ECLIA methodology.                                                                      . According to the American Urological Association, Serum PSA should decrease and remain at undetectable levels after radical prostatectomy. The AUA defines biochemical recurrence as an initial PSA value 0.2 ng/mL or greater followed by a subsequent confirmatory PSA value 0.2 ng/mL or greater. Values obtained with different assay methods or kits cannot be used interchangeably. Results cannot be interpreted as absolute evidence of the presence or absence of malignant disease.               [facility] [place]            No: [id number]           [address] [place], [place] [address]           Lindon Romp, MD         [phone]   Result(s) reported on 18 Sep 2010 at 12:49PM.     Lab Results  Component Value Date   TESTOSTERONE <3 (L) 07/14/2017    No results found for: HGBA1C  Urinalysis No results found for: COLORURINE, APPEARANCEUR, LABSPEC, PHURINE, GLUCOSEU, HGBUR, BILIRUBINUR, KETONESUR, PROTEINUR, UROBILINOGEN, NITRITE, LEUKOCYTESUR   Assessment & Plan:    1.  Biochemical recurrence of prostate cancer The patient is tolerating his Lupron therapy well.  He has responded well to his first dose of Lupron with a steep drop in his PSA.  We will continue him on indefinite Lupron therapy.  He will be given a 51-month dose of Lupron today.  He will follow-up in 6 months for repeat Lupron and PSA and testosterone prior.  Return in about 6 months  (around 01/15/2018) for for lupron. PSA/T prior.  Nickie Retort, MD  [facility] [address], [place], [place] [address] [phone]

## 2017-07-18 NOTE — Progress Notes (Signed)
No phlebotomy needed based on order parameters. Patient notified.

## 2017-07-18 NOTE — Addendum Note (Signed)
Addended by: Kyra Manges on: 07/18/2017 12:13 PM   Modules accepted: Orders

## 2017-07-30 ENCOUNTER — Inpatient Hospital Stay: Payer: Medicare Other

## 2017-09-04 ENCOUNTER — Other Ambulatory Visit: Payer: Self-pay

## 2017-09-04 ENCOUNTER — Inpatient Hospital Stay (HOSPITAL_BASED_OUTPATIENT_CLINIC_OR_DEPARTMENT_OTHER): Payer: Medicare Other | Admitting: Oncology

## 2017-09-04 ENCOUNTER — Encounter: Payer: Self-pay | Admitting: Oncology

## 2017-09-04 ENCOUNTER — Inpatient Hospital Stay: Payer: Medicare Other | Attending: Oncology

## 2017-09-04 VITALS — BP 114/74 | HR 81 | Temp 97.3°F | Wt 152.5 lb

## 2017-09-04 DIAGNOSIS — I10 Essential (primary) hypertension: Secondary | ICD-10-CM

## 2017-09-04 DIAGNOSIS — Z809 Family history of malignant neoplasm, unspecified: Secondary | ICD-10-CM | POA: Insufficient documentation

## 2017-09-04 DIAGNOSIS — Z79899 Other long term (current) drug therapy: Secondary | ICD-10-CM | POA: Insufficient documentation

## 2017-09-04 DIAGNOSIS — F17210 Nicotine dependence, cigarettes, uncomplicated: Secondary | ICD-10-CM

## 2017-09-04 DIAGNOSIS — Z79818 Long term (current) use of other agents affecting estrogen receptors and estrogen levels: Secondary | ICD-10-CM | POA: Diagnosis not present

## 2017-09-04 DIAGNOSIS — N400 Enlarged prostate without lower urinary tract symptoms: Secondary | ICD-10-CM | POA: Diagnosis not present

## 2017-09-04 DIAGNOSIS — E7800 Pure hypercholesterolemia, unspecified: Secondary | ICD-10-CM | POA: Insufficient documentation

## 2017-09-04 DIAGNOSIS — Z8042 Family history of malignant neoplasm of prostate: Secondary | ICD-10-CM

## 2017-09-04 DIAGNOSIS — D751 Secondary polycythemia: Secondary | ICD-10-CM | POA: Diagnosis not present

## 2017-09-04 DIAGNOSIS — K219 Gastro-esophageal reflux disease without esophagitis: Secondary | ICD-10-CM

## 2017-09-04 DIAGNOSIS — C61 Malignant neoplasm of prostate: Secondary | ICD-10-CM

## 2017-09-04 LAB — CBC WITH DIFFERENTIAL/PLATELET
BASOS ABS: 0.1 10*3/uL (ref 0–0.1)
Basophils Relative: 1 %
EOS ABS: 0.1 10*3/uL (ref 0–0.7)
EOS PCT: 1 %
HCT: 44.4 % (ref 40.0–52.0)
Hemoglobin: 15.4 g/dL (ref 13.0–18.0)
LYMPHS PCT: 14 %
Lymphs Abs: 1.2 10*3/uL (ref 1.0–3.6)
MCH: 32.5 pg (ref 26.0–34.0)
MCHC: 34.7 g/dL (ref 32.0–36.0)
MCV: 93.6 fL (ref 80.0–100.0)
MONO ABS: 0.8 10*3/uL (ref 0.2–1.0)
Monocytes Relative: 9 %
Neutro Abs: 6.5 10*3/uL (ref 1.4–6.5)
Neutrophils Relative %: 75 %
PLATELETS: 215 10*3/uL (ref 150–440)
RBC: 4.74 MIL/uL (ref 4.40–5.90)
RDW: 13.7 % (ref 11.5–14.5)
WBC: 8.7 10*3/uL (ref 3.8–10.6)

## 2017-09-04 NOTE — Progress Notes (Signed)
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 At that time he had JAK2 as well as exon12 mutation testing done which was negative. EPO was normal at 5.9. Patient has not had a bone marrow biopsy. CT abdomen back in 2011 did not reveal any evidence of intra-abdominal malignancy that could becontributing to polycythemia. Patient remained asymptomatic but has been requiring intermittent phlebotomy when his hematocrit is more than 60. Last phlebotomy was in December 2017   Interval history- doing well. Denies any complaints  ECOG PS- 1 Pain scale- 0   Review of systems- Review of Systems  Constitutional: Negative for chills, fever, malaise/fatigue and weight loss.  HENT: Negative for congestion, ear discharge and nosebleeds.   Eyes: Negative for blurred vision.  Respiratory: Negative for cough, hemoptysis, sputum production, shortness of breath and wheezing.   Cardiovascular: Negative for chest pain, palpitations, orthopnea and claudication.  Gastrointestinal: Negative for abdominal pain, blood in stool, constipation, diarrhea, heartburn, melena, nausea and vomiting.  Genitourinary: Negative for dysuria, flank pain, frequency, hematuria and urgency.  Musculoskeletal: Negative for back pain, joint pain and myalgias.  Skin: Negative for rash.  Neurological: Negative for dizziness, tingling, focal weakness, seizures, weakness and headaches.  Endo/Heme/Allergies: Does not bruise/bleed easily.  Psychiatric/Behavioral: Negative for  depression and suicidal ideas. The patient does not have insomnia.      No Known Allergies   Past Medical History:  Diagnosis Date  . BPH (benign prostatic hyperplasia)   . GERD (gastroesophageal reflux disease)   . Hypercholesteremia   . Hypertension   . Male erectile dysfunction   . Microscopic hematuria   . Polycythemia   . Prostate cancer ([place]) 2008   Gleeson 8, seed implant in 2011  . Urinary frequency      Past Surgical History:  Procedure Laterality Date  . CYSTOSCOPY  03/2013   by Dr. Bernardo Heater  . TRANSURETHRAL RESECTION OF PROSTATE  2015    Social History   Socioeconomic History  . Marital status: Married    Spouse name: Not on file  . Number of children: Not on file  . Years of education: Not on file  . Highest education level: Not on file  Social Needs  . Financial resource strain: Not on file  . Food insecurity - worry: Not on file  . Food insecurity - inability: Not on file  . Transportation needs - medical: Not on file  . Transportation needs - non-medical: Not on file  Occupational History  . Not on file  Tobacco Use  . Smoking status: Current Every Day Smoker    Packs/day: 0.25    Years: 51.00    Pack years: 12.75    Types: Cigarettes  . Smokeless tobacco: Never Used  Substance and Sexual Activity  . Alcohol use: No    Alcohol/week: 0.0 oz  . Drug use: No  . Sexual activity: No  Other Topics Concern  . Not on file  Social History Narrative  . Not on file    Family History  Problem Relation Age of Onset  . Prostate cancer Brother   .  Kidney cancer Neg Hx   . Bladder Cancer Neg Hx      Current Outpatient Medications:  .  amLODipine (NORVASC) 5 MG tablet, Take 5 mg by mouth daily., Disp: , Rfl:  .  donepezil (ARICEPT) 5 MG tablet, , Disp: , Rfl:  .  simvastatin (ZOCOR) 20 MG tablet, Take 20 mg by mouth daily., Disp: , Rfl:  .  tamsulosin (FLOMAX) 0.4 MG CAPS capsule, Take 4 mg by mouth daily., Disp: , Rfl:   Physical exam:    Vitals:   09/04/17 1054  BP: 114/74  Pulse: 81  Temp: (!) 97.3 F (36.3 C)  TempSrc: Tympanic  Weight: 152 lb 8 oz (69.2 kg)   Physical Exam  Constitutional: He is oriented to person, place, and time.  Thin man in no acute distress  HENT:  Head: Normocephalic and atraumatic.  Eyes: EOM are normal. Pupils are equal, round, and reactive to light.  Neck: Normal range of motion.  Cardiovascular: Normal rate, regular rhythm and normal heart sounds.  Pulmonary/Chest: Effort normal and breath sounds normal.  Abdominal: Soft. Bowel sounds are normal.  Neurological: He is alert and oriented to person, place, and time.  Skin: Skin is warm and dry.     CMP Latest Ref Rng & Units 03/13/2017  Creatinine 0.61 - 1.24 mg/dL 0.80   CBC Latest Ref Rng & Units 09/04/2017  WBC 3.8 - 10.6 K/uL 8.7  Hemoglobin 13.0 - 18.0 g/dL 15.4  Hematocrit 40.0 - 52.0 % 44.4  Platelets 150 - 440 K/uL 215    Assessment and plan- Patient is a 77 y.o. 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  He does not need phlebotomy today.  Repeat CBC with differential in 4 months and I will see him back with same labs in 8 months for possible phlebotomy  Prostate cancer- on ADT. Being managed by Dr. Pilar Jarvis.     Visit Diagnosis 1. Polycythemia, secondary      Dr. Randa Evens, MD, MPH [facility] Pager- [phone] 09/04/2017 11:58 AM

## 2017-11-04 ENCOUNTER — Emergency Department
Admission: EM | Admit: 2017-11-04 | Discharge: 2017-11-04 | Disposition: A | Discharge status: Home / Self Care | Payer: Medicare Other | Attending: Emergency Medicine | Admitting: Emergency Medicine

## 2017-11-04 ENCOUNTER — Encounter: Payer: Self-pay | Admitting: Emergency Medicine

## 2017-11-04 ENCOUNTER — Emergency Department: Payer: Medicare Other

## 2017-11-04 ENCOUNTER — Other Ambulatory Visit: Payer: Self-pay

## 2017-11-04 DIAGNOSIS — F17210 Nicotine dependence, cigarettes, uncomplicated: Secondary | ICD-10-CM | POA: Insufficient documentation

## 2017-11-04 DIAGNOSIS — Z8546 Personal history of malignant neoplasm of prostate: Secondary | ICD-10-CM | POA: Insufficient documentation

## 2017-11-04 DIAGNOSIS — Z79899 Other long term (current) drug therapy: Secondary | ICD-10-CM | POA: Insufficient documentation

## 2017-11-04 DIAGNOSIS — R4020 Unspecified coma: Secondary | ICD-10-CM

## 2017-11-04 DIAGNOSIS — R569 Unspecified convulsions: Secondary | ICD-10-CM | POA: Diagnosis present

## 2017-11-04 DIAGNOSIS — I10 Essential (primary) hypertension: Secondary | ICD-10-CM | POA: Diagnosis not present

## 2017-11-04 DIAGNOSIS — R55 Syncope and collapse: Secondary | ICD-10-CM | POA: Diagnosis not present

## 2017-11-04 LAB — BASIC METABOLIC PANEL
Anion gap: 12 (ref 5–15)
BUN: 14 mg/dL (ref 6–20)
CO2: 24 mmol/L (ref 22–32)
CREATININE: 0.83 mg/dL (ref 0.61–1.24)
Calcium: 9.3 mg/dL (ref 8.9–10.3)
Chloride: 101 mmol/L (ref 101–111)
GFR calc Af Amer: >60 mL/min (ref >60)
GLUCOSE: 131 mg/dL — AB (ref 65–99)
POTASSIUM: 3.5 mmol/L (ref 3.5–5.1)
SODIUM: 137 mmol/L (ref 135–145)

## 2017-11-04 LAB — URINALYSIS, COMPLETE (UACMP) WITH MICROSCOPIC
Bacteria, UA: NONE SEEN
Bilirubin Urine: NEGATIVE
Glucose, UA: NEGATIVE mg/dL
Hgb urine dipstick: NEGATIVE
Ketones, ur: NEGATIVE mg/dL
Leukocytes, UA: NEGATIVE
NITRITE: NEGATIVE
Protein, ur: 30 mg/dL — AB
SPECIFIC GRAVITY, URINE: 1.024 (ref 1.005–1.030)
pH: 5 (ref 5.0–8.0)

## 2017-11-04 LAB — CBC
HEMATOCRIT: 41.4 % (ref 40.0–52.0)
Hemoglobin: 14.1 g/dL (ref 13.0–18.0)
MCH: 32.3 pg (ref 26.0–34.0)
MCHC: 34 g/dL (ref 32.0–36.0)
MCV: 94.9 fL (ref 80.0–100.0)
PLATELETS: 232 10*3/uL (ref 150–440)
RBC: 4.37 MIL/uL — ABNORMAL LOW (ref 4.40–5.90)
RDW: 13.7 % (ref 11.5–14.5)
WBC: 5.8 10*3/uL (ref 3.8–10.6)

## 2017-11-04 LAB — TROPONIN I

## 2017-11-04 NOTE — ED Provider Notes (Signed)
[facility] Emergency Department Provider Note  ____________________________________________  Time seen: Approximately 5:53 PM  I have reviewed the triage vital signs and the nursing notes.   HISTORY  Chief Complaint Seizures   HPI Jerry Haynes is a 77 y.o. male with a history of prostate cancer status post seed implantation in 2011 currently cancer free, hypertension, hyperlipidemia, and BPH who presents for evaluation of a seizure. According to the wife patient was in the room next to her. She started hearing grunting sounds. When she walked in the room patient had vomited all over himself and he was foaming from the mouth, he also had urinary incontinence. Patient was slightly confused however that cleared after 1 minute. She did not witness any seizure-like activity. Patient does not remember what happened. He remembers sitting on the chair during his puzzles and then waking up with his wife talking to him.He has been back to his baseline. No prior history of seizure. No head trauma, no blood thinners. He denies headache, chest pain, palpitations, fever or chills, vomiting or diarrhea.  Past Medical History:  Diagnosis Date  . BPH (benign prostatic hyperplasia)   . GERD (gastroesophageal reflux disease)   . Hypercholesteremia   . Hypertension   . Male erectile dysfunction   . Microscopic hematuria   . Polycythemia   . Prostate cancer ([place]) 2008   Gleeson 8, seed implant in 2011  . Urinary frequency     Patient Active Problem List   Diagnosis Date Noted  . Loss of memory 04/21/2017  . Benign prostatic hyperplasia without lower urinary tract symptoms 08/06/2016  . Erythrocytosis 10/20/2015  . Prostate cancer ([place]) 03/10/2014  . Hypertension 03/10/2014  . Hyperlipidemia, unspecified 03/10/2014  . Increased frequency of urination 08/10/2012  . Microscopic hematuria 08/10/2012  . Male erectile dysfunction 08/10/2012  . ED (erectile dysfunction)  of organic origin 08/10/2012    Past Surgical History:  Procedure Laterality Date  . CYSTOSCOPY  03/2013   by Dr. Bernardo Heater  . TRANSURETHRAL RESECTION OF PROSTATE  2015    Prior to Admission medications   Medication Sig Start Date End Date Taking? Authorizing Provider  amLODipine (NORVASC) 5 MG tablet Take 5 mg by mouth daily. 10/16/16   [provider]  donepezil (ARICEPT) 5 MG tablet  06/02/17   [provider]  simvastatin (ZOCOR) 20 MG tablet Take 20 mg by mouth daily. 07/10/16 09/04/17  [provider]  tamsulosin (FLOMAX) 0.4 MG CAPS capsule Take 4 mg by mouth daily. 10/09/15   [provider]    Allergies Patient has no known allergies.  Family History  Problem Relation Age of Onset  . Prostate cancer Brother   . Kidney cancer Neg Hx   . Bladder Cancer Neg Hx     Social History Social History   Tobacco Use  . Smoking status: Current Every Day Smoker    Packs/day: 0.25    Years: 51.00    Pack years: 12.75    Types: Cigarettes  . Smokeless tobacco: Never Used  Substance Use Topics  . Alcohol use: No    Alcohol/week: 0.0 oz  . Drug use: No    Review of Systems  Constitutional: Negative for fever. Eyes: Negative for visual changes. ENT: Negative for sore throat. Neck: No neck pain  Cardiovascular: Negative for chest pain. Respiratory: Negative for shortness of breath. Gastrointestinal: Negative for abdominal pain, vomiting or diarrhea. Genitourinary: Negative for dysuria. Musculoskeletal: Negative for back pain. Skin: Negative for rash.  Neurological: Negative for headaches, weakness or numbness. + seizure Psych: No SI or HI  ____________________________________________   PHYSICAL EXAM:  VITAL SIGNS: ED Triage Vitals [11/04/17 1612]  Enc Vitals Group     BP (!) 102/59     Pulse Rate 81     Resp 18     Temp 97.9 F (36.6 C)     Temp Source Oral     SpO2 99 %     Weight 152 lb (68.9 kg)     Height 6\' 4"  (1.93 m)      Head Circumference      Peak Flow      Pain Score      Pain Loc      Pain Edu?      Excl. in [place]?     Constitutional: Alert and oriented. Well appearing and in no apparent distress. HEENT:      Head: Normocephalic and atraumatic.         Eyes: Conjunctivae are normal. Sclera is non-icteric.       Mouth/Throat: Mucous membranes are moist.       Neck: Supple with no signs of meningismus. Cardiovascular: Regular rate and rhythm. No murmurs, gallops, or rubs. 2+ symmetrical distal pulses are present in all extremities. No JVD. Respiratory: Normal respiratory effort. Lungs are clear to auscultation bilaterally. No wheezes, crackles, or rhonchi.  Gastrointestinal: Soft, non tender, and non distended with positive bowel sounds. No rebound or guarding. Musculoskeletal: Nontender with normal range of motion in all extremities. No edema, cyanosis, or erythema of extremities. Neurologic: Normal speech and language. A & O x3, PERRL, EOMI, no nystagmus, CN II-XII intact, motor testing reveals good tone and bulk throughout. There is no evidence of pronator drift or dysmetria. Muscle strength is 5/5 throughout.Sensory examination is intact. Gait deferred Skin: Skin is warm, dry and intact. No rash noted. Psychiatric: Mood and affect are normal. Speech and behavior are normal.  ____________________________________________   LABS (all labs ordered are listed, but only abnormal results are displayed)  Labs Reviewed  BASIC METABOLIC PANEL - Abnormal; Notable for the following components:      Result Value   Glucose, Bld 131 (*)    All other components within normal limits  CBC - Abnormal; Notable for the following components:   RBC 4.37 (*)    All other components within normal limits  URINALYSIS, COMPLETE (UACMP) WITH MICROSCOPIC - Abnormal; Notable for the following components:   Color, Urine AMBER (*)    APPearance HAZY (*)    Protein, ur 30 (*)    Squamous Epithelial / LPF 0-5 (*)    All  other components within normal limits  TROPONIN I  CBG MONITORING, ED   ____________________________________________  EKG  ED ECG REPORT I, Rudene Re, the attending physician, personally viewed and interpreted this ECG.  Normal sinus rhythm, first-degree AV block, intraventricular conduction delay, normal axis, no STE or depressions, no evidence of HOCM, AV block, delta wave, ARVD, prolonged QTc, WPW, or Brug[place]. Unchanged from prior from 2012   ____________________________________________  RADIOLOGY  I have personally reviewed the images performed during this visit and I agree with the Radiologist's read.   Interpretation by Radiologist:  Ct Head Wo Contrast  Result Date: 11/04/2017 CLINICAL DATA:  Seizure.  History of prostate cancer. EXAM: CT HEAD WITHOUT CONTRAST TECHNIQUE: Contiguous axial images were obtained from the base of the skull through the vertex without intravenous contrast. COMPARISON:  None. FINDINGS: Brain: No evidence of parenchymal  hemorrhage or extra-axial fluid collection. No mass lesion, mass effect, or midline shift. No CT evidence of acute infarction. Generalized cerebral volume loss. Nonspecific mild subcortical and periventricular white matter hypodensity, most in keeping with chronic small vessel ischemic change. No ventriculomegaly. Vascular: No acute abnormality. Skull: No evidence of calvarial fracture. Sinuses/Orbits: The visualized paranasal sinuses are essentially clear. Other: Partial inferior right mastoid effusion. Clear left mastoid air cells. IMPRESSION: 1. No evidence of acute intracranial abnormality. No evidence of calvarial fracture. 2. Generalized cerebral volume loss and mild chronic small vessel ischemic changes in the cerebral white matter. 3. Nonspecific partial right mastoid effusion. Electronically Signed   By: Ilona Sorrel M.D.   On: 11/04/2017 17:31     ____________________________________________   PROCEDURES  Procedure(s)  performed: None Procedures Critical Care performed:  None ____________________________________________   INITIAL IMPRESSION / ASSESSMENT AND PLAN / ED COURSE  77 y.o. male with a history of prostate cancer status post seed implantation in 2011 currently cancer free, hypertension, hyperlipidemia, and BPH who presents for evaluation of an episode of LOC (seizure vs syncope). Patient with no history of seizures, complete and neurologically intact at this time. No generalized tonic-clonic activity witnessed however since patient was incontinent of urine and has no recollection of the event I'm leaning more towards seizure however syncope is also possible considering that patient had no post ictal phase. We'll get an EKG to evaluate for any dysrhythmias or ischemia. We'll get a head CT since patient has a history of cancer to rule out any metastases or recurrence. We'll check basic labs for electrolyte abnormalities. Blood glucose is normal. We'll monitor in the emergency room. If workup is negative and patient has no further episodes plan to discharge home with close follow-up with primary care doctor.    _________________________ 7:55 PM on 11/04/2017 -----------------------------------------  Workup essentially unremarkable with normal labs, head CT, EKG. Patient observed in the emergency department for several hours with no further episodes of loss of consciousness. Discussed with the family that at this time we are unsure if this was a syncopal event or a seizure. Recommended close follow-up with primary care doctor. Discussed seizure precautions with family. Patient is now stable for discharge.   As part of my medical decision making, I reviewed the following data within the [place] History obtained from family, Nursing notes reviewed and incorporated, Labs reviewed , EKG interpreted , Old EKG and chart reviewed, Radiograph reviewed , Notes from prior ED visits and [place] Controlled  Substance Database    Pertinent labs & imaging results that were available during my care of the patient were reviewed by me and considered in my medical decision making (see chart for details).    ____________________________________________   FINAL CLINICAL IMPRESSION(S) / ED DIAGNOSES  Final diagnoses:  Loss of consciousness ([place])      NEW MEDICATIONS STARTED DURING THIS VISIT:  ED Discharge Orders    None       Note:  This document was prepared using Dragon voice recognition software and may include unintentional dictation errors.    Alfred Levins, [place], MD 11/04/17 [phone]

## 2017-11-04 NOTE — ED Triage Notes (Signed)
Pt presents with family; wife states that he was in another room and she heard some commotion and went in to find him in the chair with vomitus on him, he was drooling, and he had been incontinent of bladder. Pt does not remember any of this. Wife reports that pt is at baseline at this time. Pt alert & oriented at this time.

## 2017-11-04 NOTE — ED Notes (Signed)

## 2017-11-04 NOTE — Discharge Instructions (Signed)
Follow up with your doctor in 2-3 days. Return to the emergency room if you have any further episodes of passing out or seizure like activity. Do not drive until you are cleared by your primary care doctor.

## 2017-12-17 DIAGNOSIS — R569 Unspecified convulsions: Secondary | ICD-10-CM | POA: Insufficient documentation

## 2018-01-01 ENCOUNTER — Inpatient Hospital Stay: Payer: Medicare Other | Attending: Oncology

## 2018-01-28 ENCOUNTER — Other Ambulatory Visit: Payer: Medicare Other

## 2018-01-28 ENCOUNTER — Encounter: Payer: Self-pay | Admitting: Urology

## 2018-01-30 ENCOUNTER — Ambulatory Visit: Payer: Medicare Other

## 2018-02-24 ENCOUNTER — Other Ambulatory Visit: Payer: Medicare Other

## 2018-02-24 DIAGNOSIS — C61 Malignant neoplasm of prostate: Secondary | ICD-10-CM

## 2018-02-25 LAB — TESTOSTERONE

## 2018-02-25 LAB — PSA: Prostate Specific Ag, Serum: <0.1 ng/mL (ref 0.0–4.0)

## 2018-02-26 ENCOUNTER — Encounter: Payer: Self-pay | Admitting: Urology

## 2018-02-26 ENCOUNTER — Ambulatory Visit: Payer: Medicare Other | Admitting: Urology

## 2018-02-26 VITALS — BP 91/56 | HR 88 | Resp 16 | Ht 73.0 in | Wt 144.0 lb

## 2018-02-26 DIAGNOSIS — R3912 Poor urinary stream: Secondary | ICD-10-CM | POA: Diagnosis not present

## 2018-02-26 DIAGNOSIS — C61 Malignant neoplasm of prostate: Secondary | ICD-10-CM | POA: Diagnosis not present

## 2018-02-26 DIAGNOSIS — N401 Enlarged prostate with lower urinary tract symptoms: Secondary | ICD-10-CM

## 2018-02-26 DIAGNOSIS — N138 Other obstructive and reflux uropathy: Secondary | ICD-10-CM

## 2018-02-26 NOTE — Progress Notes (Signed)
02/26/2018 1:50 PM   Jerry Haynes 06-Aug-1941 [id number]  Referring provider: Baxter Hire, MD [place], [place] [address]  No chief complaint on file.   HPI:  F/u - M0 hormone sensitive recurrent prostate cancer -- He received brachytherapy for prostate cancer in 2011. PSA and pathology reports are not available. He underwent TURP in 2014 for BPH and was found to prostate cancer in the specimen. Pathology was Gleason 3+3=6 in 5% of specimen. He elected to undergo surveillance that time.PSA at that time was 0.6. PSA in 2.17 in 10/2015. PSA in December 2017 was 3.2. PSA rose to 4.1 in June 2018.DRE benign Dec 2017. He underwent a CT and bone scan for his biochemical recurrence. These were both negative. He was started androgen deprivation therapy for biochemical recurrence of prostate cancer at his last visit on April 10, 2017.  His PSA decreased to 0.7 in November 2018.  His testosterone is less than 3.  2) BPH - He is currently on flomax for his BPH. TURP in 2014 as above.   Today, patient is seen for the above. His PSA is undetectable. He has no voiding complaints. He has a good flow. He continues tamsulosin.  No gross hematuria.    PMH: Past Medical History:  Diagnosis Date  . BPH (benign prostatic hyperplasia)   . GERD (gastroesophageal reflux disease)   . Hypercholesteremia   . Hypertension   . Male erectile dysfunction   . Microscopic hematuria   . Polycythemia   . Prostate cancer ([place]) 2008   Gleeson 8, seed implant in 2011  . Urinary frequency     Surgical History: Past Surgical History:  Procedure Laterality Date  . CYSTOSCOPY  03/2013   by Dr. Bernardo Heater  . TRANSURETHRAL RESECTION OF PROSTATE  2015    Home Medications:  Allergies as of 02/26/2018   No Known Allergies     Medication List        Accurate as of 02/26/18  1:50 PM. Always use your most recent med list.          amLODipine 5 MG tablet Commonly known as:   NORVASC Take 5 mg by mouth daily.   donepezil 5 MG tablet Commonly known as:  ARICEPT   simvastatin 20 MG tablet Commonly known as:  ZOCOR Take 20 mg by mouth daily.   tamsulosin 0.4 MG Caps capsule Commonly known as:  FLOMAX Take 4 mg by mouth daily.       Allergies: No Known Allergies  Family History: Family History  Problem Relation Age of Onset  . Prostate cancer Brother   . Kidney cancer Neg Hx   . Bladder Cancer Neg Hx     Social History:  reports that he has been smoking cigarettes.  He has a 12.75 pack-year smoking history. He has never used smokeless tobacco. He reports that he does not drink alcohol or use drugs.  ROS:                                        Physical Exam: There were no vitals taken for this visit.  Constitutional:  Alert and oriented, No acute distress. HEENT: [place] AT, moist mucus membranes.  Trachea midline, no masses. Cardiovascular: No clubbing, cyanosis, or edema. Respiratory: Normal respiratory effort, no increased work of breathing. GI: Abdomen is soft, nontender, nondistended, no abdominal masses GU: No CVA  tenderness Lymph: No cervical or inguinal lymphadenopathy. Skin: No rashes, bruises or suspicious lesions. Neurologic: Grossly intact, no focal deficits, moving all 4 extremities. Psychiatric: Normal mood and affect.  Laboratory Data: Lab Results  Component Value Date   WBC 5.8 11/04/2017   HGB 14.1 11/04/2017   HCT 41.4 11/04/2017   MCV 94.9 11/04/2017   PLT 232 11/04/2017    Lab Results  Component Value Date   CREATININE 0.83 11/04/2017    Lab Results  Component Value Date   PSA 2.17 10/20/2015   PSA  12/28/2010    ========== TEST NAME ==========  ========= RESULTS =========  = REFERENCE RANGE =  PROSTATE-SPECIFIC AG.  PSA, Serum (Serial Monitor) Prostate Specific Ag, Serum     [   2.5 ng/mL            ]           0.0-4.0 Roche ECLIA methodology.                                                                       . According to the American Urological Association, Serum PSA should decrease and remain at undetectable levels after radical prostatectomy. The AUA defines biochemical recurrence as an initial PSA value 0.2 ng/mL or greater followed by a subsequent confirmatory PSA value 0.2 ng/mL or greater. Values obtained with different assay methods or kits cannot be used interchangeably. Results cannot be interpreted as absolute evidence of the presence or absence of malignant disease.               [facility]            No: [id number]           [address] [place], [place] [address]           Lindon Romp, MD         [phone]   Result(s) reported on 29 Dec 2010 at 05:17PM.    PSA  09/17/2010    ========== TEST NAME ==========  ========= RESULTS =========  = REFERENCE RANGE =  PROSTATE-SPECIFIC AG.  PSA, Serum (Serial Monitor) Prostate Specific Ag, Serum     [H  9.8 ng/mL            ]           0.0-4.0 Roche ECLIA methodology.                                                                      . According to the American Urological Association, Serum PSA should decrease and remain at undetectable levels after radical prostatectomy. The AUA defines biochemical recurrence as an initial PSA value 0.2 ng/mL or greater followed by a subsequent confirmatory PSA value 0.2 ng/mL or greater. Values obtained with different assay methods or kits cannot be used interchangeably. Results cannot be interpreted as absolute evidence of the presence or absence of malignant disease.  [facility]            No: [id number]           [address] [place], [place] [address]           Lindon Romp, MD         [phone]   Result(s) reported on 18 Sep 2010 at 12:49PM.     Lab Results  Component Value Date   TESTOSTERONE <3 (L) 02/24/2018    No results found for: HGBA1C  Urinalysis    Component Value Date/Time    COLORURINE AMBER (A) 11/04/2017 1854   APPEARANCEUR HAZY (A) 11/04/2017 1854   LABSPEC 1.024 11/04/2017 1854   PHURINE 5.0 11/04/2017 1854   GLUCOSEU NEGATIVE 11/04/2017 [place] NEGATIVE 11/04/2017 [place] NEGATIVE 11/04/2017 [place] NEGATIVE 11/04/2017 1854   PROTEINUR 30 (A) 11/04/2017 1854   NITRITE NEGATIVE 11/04/2017 1854   LEUKOCYTESUR NEGATIVE 11/04/2017 1854    Lab Results  Component Value Date   BACTERIA NONE SEEN 11/04/2017   CT and bone scan images reviewed -- 2018  No results found for this or any previous visit. No results found for this or any previous visit. No results found for this or any previous visit. No results found for this or any previous visit. No results found for this or any previous visit. No results found for this or any previous visit. No results found for this or any previous visit. No results found for this or any previous visit.  Assessment & Plan:    PCa - PSA undetectable. Discussed with pt and wife the nature r/b of IADT vs cADT. We will hold off on further ADT. Check PSA and T is 3 months and 6 months.    No follow-ups on file.  Festus Aloe, MD  [facility] [address], [place], [place] [address] [phone]

## 2018-05-05 ENCOUNTER — Inpatient Hospital Stay: Payer: Medicare Other | Attending: Oncology | Admitting: Oncology

## 2018-05-05 ENCOUNTER — Inpatient Hospital Stay: Payer: Medicare Other

## 2018-05-05 ENCOUNTER — Encounter: Payer: Self-pay | Admitting: Oncology

## 2018-05-05 VITALS — BP 97/62 | HR 80 | Temp 80.0°F | Resp 18 | Ht 73.0 in | Wt 142.4 lb

## 2018-05-05 DIAGNOSIS — F17210 Nicotine dependence, cigarettes, uncomplicated: Secondary | ICD-10-CM | POA: Insufficient documentation

## 2018-05-05 DIAGNOSIS — Z862 Personal history of diseases of the blood and blood-forming organs and certain disorders involving the immune mechanism: Secondary | ICD-10-CM

## 2018-05-05 DIAGNOSIS — D649 Anemia, unspecified: Secondary | ICD-10-CM | POA: Insufficient documentation

## 2018-05-05 DIAGNOSIS — D751 Secondary polycythemia: Secondary | ICD-10-CM

## 2018-05-05 LAB — CBC WITH DIFFERENTIAL/PLATELET
Basophils Absolute: 0 10*3/uL (ref 0–0.1)
Basophils Relative: 1 %
EOS ABS: 0.2 10*3/uL (ref 0–0.7)
Eosinophils Relative: 3 %
HEMATOCRIT: 36.4 % — AB (ref 40.0–52.0)
HEMOGLOBIN: 12.5 g/dL — AB (ref 13.0–18.0)
LYMPHS ABS: 1.5 10*3/uL (ref 1.0–3.6)
LYMPHS PCT: 24 %
MCH: 30.9 pg (ref 26.0–34.0)
MCHC: 34.2 g/dL (ref 32.0–36.0)
MCV: 90.4 fL (ref 80.0–100.0)
MONOS PCT: 9 %
Monocytes Absolute: 0.5 10*3/uL (ref 0.2–1.0)
NEUTROS ABS: 3.8 10*3/uL (ref 1.4–6.5)
NEUTROS PCT: 63 %
Platelets: 221 10*3/uL (ref 150–440)
RBC: 4.03 MIL/uL — AB (ref 4.40–5.90)
RDW: 14.3 % (ref 11.5–14.5)
WBC: 6 10*3/uL (ref 3.8–10.6)

## 2018-05-05 NOTE — Progress Notes (Signed)
No new changes noted today 

## 2018-05-05 NOTE — Progress Notes (Signed)
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 At that time he had JAK2 as well as exon12 mutation testing done which was negative. EPO was normal at 5.9. Patient has not had a bone marrow biopsy. CT abdomen back in 2011 did not reveal any evidence of intra-abdominal malignancy that could becontributing to polycythemia. Patient remained asymptomatic but has been requiring intermittent phlebotomy when his hematocrit is more than 60. Last phlebotomy was in December 2017    Interval history- he is doing well. Denies any blood in stool or urine. Appetite is good. Denies any unintentional weight loss  ECOG PS- 1 Pain scale- 0 Opioid associated constipation- no  Review of systems- Review of Systems  Constitutional: Positive for malaise/fatigue. Negative for chills, fever and weight loss.  HENT: Negative for congestion, ear discharge and nosebleeds.   Eyes: Negative for blurred vision.  Respiratory: Negative for cough, hemoptysis, sputum production, shortness of breath and wheezing.   Cardiovascular: Negative for chest pain, palpitations, orthopnea and claudication.  Gastrointestinal: Negative for abdominal pain, blood in stool, constipation, diarrhea, heartburn, melena, nausea and vomiting.  Genitourinary: Negative for dysuria, flank pain, frequency, hematuria and urgency.  Musculoskeletal: Negative for back pain, joint pain and myalgias.  Skin: Negative for rash.  Neurological: Negative for dizziness, tingling, focal weakness,  seizures, weakness and headaches.  Endo/Heme/Allergies: Does not bruise/bleed easily.  Psychiatric/Behavioral: Negative for depression and suicidal ideas. The patient does not have insomnia.       No Known Allergies   Past Medical History:  Diagnosis Date  . BPH (benign prostatic hyperplasia)   . GERD (gastroesophageal reflux disease)   . Hypercholesteremia   . Hypertension   . Male erectile dysfunction   . Microscopic hematuria   . Polycythemia   . Prostate cancer ([place]) 2008   Gleeson 8, seed implant in 2011  . Urinary frequency      Past Surgical History:  Procedure Laterality Date  . CYSTOSCOPY  03/2013   by Dr. Bernardo Heater  . TRANSURETHRAL RESECTION OF PROSTATE  2015    Social History   Socioeconomic History  . Marital status: Married    Spouse name: Not on file  . Number of children: Not on file  . Years of education: Not on file  . Highest education level: Not on file  Occupational History  . Not on file  Social Needs  . Financial resource strain: Not on file  . Food insecurity:    Worry: Not on file    Inability: Not on file  . Transportation needs:    Medical: Not on file    Non-medical: Not on file  Tobacco Use  . Smoking status: Current Every Day Smoker    Packs/day: 0.25    Years: 51.00    Pack years: 12.75    Types: Cigarettes  . Smokeless tobacco: Never Used  Substance and Sexual Activity  . Alcohol use: No    Alcohol/week: 0.0 standard drinks  . Drug use: No  . Sexual activity: Never  Lifestyle  . Physical activity:    Days per week: Not on file  Minutes per session: Not on file  . Stress: Not on file  Relationships  . Social connections:    Talks on phone: Not on file    Gets together: Not on file    Attends religious service: Not on file    Active member of club or organization: Not on file    Attends meetings of clubs or organizations: Not on file    Relationship status: Not on file  . Intimate partner violence:    Fear of  current or ex partner: Not on file    Emotionally abused: Not on file    Physically abused: Not on file    Forced sexual activity: Not on file  Other Topics Concern  . Not on file  Social History Narrative  . Not on file    Family History  Problem Relation Age of Onset  . Prostate cancer Brother   . Kidney cancer Neg Hx   . Bladder Cancer Neg Hx      Current Outpatient Medications:  .  amLODipine (NORVASC) 5 MG tablet, Take 5 mg by mouth daily., Disp: , Rfl:  .  donepezil (ARICEPT) 5 MG tablet, , Disp: , Rfl:  .  simvastatin (ZOCOR) 20 MG tablet, Take 20 mg by mouth daily., Disp: , Rfl:  .  tamsulosin (FLOMAX) 0.4 MG CAPS capsule, Take 4 mg by mouth daily., Disp: , Rfl:   Physical exam: There were no vitals filed for this visit. Physical Exam  Constitutional: He is oriented to person, place, and time.  Thin elderly man in no acute distress  HENT:  Head: Normocephalic and atraumatic.  Eyes: Pupils are equal, round, and reactive to light. EOM are normal.  Neck: Normal range of motion.  Cardiovascular: Normal rate, regular rhythm and normal heart sounds.  Pulmonary/Chest: Effort normal and breath sounds normal.  Abdominal: Soft. Bowel sounds are normal.  Neurological: He is alert and oriented to person, place, and time.  Skin: Skin is warm and dry.     CMP Latest Ref Rng & Units 11/04/2017  Glucose 65 - 99 mg/dL 131(H)  BUN 6 - 20 mg/dL 14  Creatinine 0.61 - 1.24 mg/dL 0.83  Sodium 135 - 145 mmol/L 137  Potassium 3.5 - 5.1 mmol/L 3.5  Chloride 101 - 111 mmol/L 101  CO2 22 - 32 mmol/L 24  Calcium 8.9 - 10.3 mg/dL 9.3   CBC Latest Ref Rng & Units 05/05/2018  WBC 3.8 - 10.6 K/uL 6.0  Hemoglobin 13.0 - 18.0 g/dL 12.5(L)  Hematocrit 40.0 - 52.0 % 36.4(L)  Platelets 150 - 440 K/uL 221      Assessment and plan- Patient is a 77 y.o. 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 He does not need phlebotomy at this time.  In fact patient's  hemoglobin has been steadily going down since October 2018 from 18-17 to 15.4-14 0.1-12.5 today.    It is possible that his mild anemia is secondary to ADT. Last dose given by urology in nov 2018  I will see him back in 4 months with CBC ferritin and iron studies B12 and folate   Visit Diagnosis 1. History of polycythemia   2. Normocytic anemia      Dr. Randa Evens, MD, MPH [facility] [phone] 05/05/2018 2:50 PM

## 2018-05-27 ENCOUNTER — Other Ambulatory Visit: Payer: Medicare Other

## 2018-05-27 DIAGNOSIS — C61 Malignant neoplasm of prostate: Secondary | ICD-10-CM

## 2018-05-28 LAB — TESTOSTERONE: Testosterone: 349 ng/dL (ref 264–916)

## 2018-05-28 LAB — PSA: PROSTATE SPECIFIC AG, SERUM: 1.2 ng/mL (ref 0.0–4.0)

## 2018-06-01 ENCOUNTER — Telehealth: Payer: Self-pay | Admitting: Family Medicine

## 2018-06-01 NOTE — Telephone Encounter (Signed)
-----   Message from Festus Aloe, MD sent at 05/28/2018  7:46 PM EDT ----- Let pt know his PSa remains low and Testosterone has rebounded. F/u in 3 months with a PSA prior -- we might resume Lupron at that time.    ----- Message ----- From: Kyra Manges, CMA Sent: 05/28/2018   9:27 AM EDT To: Festus Aloe, MD    ----- Message ----- From: Lavone Neri Lab Results In Sent: 05/28/2018   5:41 AM EDT To: [facility]

## 2018-06-01 NOTE — Telephone Encounter (Signed)
Patients daughter notified and will call back to schedule appointment 

## 2018-08-12 ENCOUNTER — Other Ambulatory Visit: Payer: Self-pay

## 2018-08-12 DIAGNOSIS — C61 Malignant neoplasm of prostate: Secondary | ICD-10-CM

## 2018-08-13 ENCOUNTER — Other Ambulatory Visit: Payer: Self-pay

## 2018-08-13 ENCOUNTER — Encounter: Payer: Self-pay | Admitting: Urology

## 2018-08-18 ENCOUNTER — Other Ambulatory Visit: Payer: Medicare Other

## 2018-08-19 ENCOUNTER — Encounter: Payer: Self-pay | Admitting: Urology

## 2018-08-19 ENCOUNTER — Ambulatory Visit: Payer: Self-pay | Admitting: Urology

## 2018-08-19 NOTE — Progress Notes (Incomplete)
08/19/2018 6:48 AM   Jerry Haynes 31-Oct-1940 [id number]  Referring provider: Baxter Hire, MD [place], [place] [address]  No chief complaint on file.   HPI: Jerry Haynes is a 77 yo male with a history of prostate cancer and BPH with obstruction/LUTS who presents today today for the evaluation and management of Gleason 3+3 prostate cancer.   He received brachytherapy for prostate cancer in 2011. He underwent TURP in 2014 for BPH which indicated prostate cancer. The pathology of the specimen was Gleason 3+3 = 6 in 5% of specimen.  PSA trend below.  He was started on androgen deprivation therapy for prostate cancer on April 10, 2017. PSA decreased onwards.   Pt is currently on flomax for his BPH.   PSA   Component     Latest Ref Rng & Units 08/06/2016 02/06/2017 07/14/2017 02/24/2018  Prostate Specific Ag, Serum     0.0 - 4.0 ng/mL 3.2 4.1 (H) 0.7 <0.1   Component     Latest Ref Rng & Units 05/27/2018  Prostate Specific Ag, Serum     0.0 - 4.0 ng/mL 1.2    PMH: Past Medical History:  Diagnosis Date   BPH (benign prostatic hyperplasia)    GERD (gastroesophageal reflux disease)    Hypercholesteremia    Hypertension    Male erectile dysfunction    Microscopic hematuria    Polycythemia    Prostate cancer ([place]) 2008   Gleeson 8, seed implant in 2011   Urinary frequency     Surgical History: Past Surgical History:  Procedure Laterality Date   CYSTOSCOPY  03/2013   by Dr. Bernardo Heater   TRANSURETHRAL RESECTION OF PROSTATE  2015    Home Medications:  Allergies as of 08/19/2018   No Known Allergies     Medication List       Accurate as of August 19, 2018  6:48 AM. Always use your most recent med list.        amLODipine 5 MG tablet Commonly known as:  NORVASC Take 5 mg by mouth daily.   donepezil 5 MG tablet Commonly known as:  ARICEPT   simvastatin 20 MG tablet Commonly known as:  ZOCOR Take 20 mg by mouth daily.     tamsulosin 0.4 MG Caps capsule Commonly known as:  FLOMAX Take 4 mg by mouth daily.       Allergies: No Known Allergies  Family History: Family History  Problem Relation Age of Onset   Prostate cancer Brother    Kidney cancer Neg Hx    Bladder Cancer Neg Hx     Social History:  reports that he has been smoking cigarettes. He has a 12.75 pack-year smoking history. He has never used smokeless tobacco. He reports that he does not drink alcohol or use drugs.  ROS:                                        Physical Exam: There were no vitals taken for this visit.  Constitutional: Well nourished. Alert and oriented, No acute distress. HEENT: [place] AT, moist mucus membranes. Trachea midline, no masses. Cardiovascular: No clubbing, cyanosis, or edema. Respiratory: Normal respiratory effort, no increased work of breathing. GI: Abdomen is soft, non tender, non distended, no abdominal masses. Liver and spleen not palpable.  No hernias appreciated.  Stool sample for occult testing is not indicated.  GU: No CVA tenderness.  No bladder fullness or masses.  Patient with circumcised/uncircumcised phallus. ***Foreskin easily retracted***  Urethral meatus is patent.  No penile discharge. No penile lesions or rashes. Scrotum without lesions, cysts, rashes and/or edema.  Testicles are located scrotally bilaterally. No masses are appreciated in the testicles. Left and right epididymis are normal. Rectal: Patient with  normal sphincter tone. Anus and perineum without scarring or rashes. No rectal masses are appreciated. Prostate is approximately *** grams, *** nodules are appreciated. Seminal vesicles are normal. Skin: No rashes, bruises or suspicious lesions. Lymph: No cervical or inguinal adenopathy. Neurologic: Grossly intact, no focal deficits, moving all 4 extremities. Psychiatric: Normal mood and affect.    Laboratory Data: Component     Latest Ref Rng & Units  07/14/2017 02/24/2018 05/27/2018  Testosterone     264 - 916 ng/dL <3 (L) <3 (L) 349   Pertinent Imaging: *** No results found for this or any previous visit. No results found for this or any previous visit. No results found for this or any previous visit. No results found for this or any previous visit. No results found for this or any previous visit. No results found for this or any previous visit. No results found for this or any previous visit. No results found for this or any previous visit.  Assessment & Plan:   1. Prostate Cancer  Lupron   2. BPH with obstruction/LUTS Continue Flomax   No follow-ups on file.  [facility] [address], [place], [place] [address] [phone]  I, Lucas Mallow, am acting as a Education administrator for [employer],  {Add Scribe Attestation Statement}

## 2018-08-20 ENCOUNTER — Ambulatory Visit: Payer: Medicare Other | Admitting: Urology

## 2018-09-04 ENCOUNTER — Other Ambulatory Visit: Payer: Medicare Other

## 2018-09-04 ENCOUNTER — Ambulatory Visit: Payer: Medicare Other | Admitting: Oncology

## 2018-09-15 ENCOUNTER — Encounter (INDEPENDENT_AMBULATORY_CARE_PROVIDER_SITE_OTHER): Payer: Self-pay

## 2018-09-15 ENCOUNTER — Inpatient Hospital Stay: Payer: Medicare Other | Attending: Oncology

## 2018-09-15 ENCOUNTER — Encounter: Payer: Self-pay | Admitting: Oncology

## 2018-09-15 ENCOUNTER — Inpatient Hospital Stay (HOSPITAL_BASED_OUTPATIENT_CLINIC_OR_DEPARTMENT_OTHER): Payer: Medicare Other | Admitting: Oncology

## 2018-09-15 VITALS — BP 96/65 | HR 80 | Temp 97.4°F | Resp 16 | Wt 146.2 lb

## 2018-09-15 DIAGNOSIS — K219 Gastro-esophageal reflux disease without esophagitis: Secondary | ICD-10-CM

## 2018-09-15 DIAGNOSIS — E7800 Pure hypercholesterolemia, unspecified: Secondary | ICD-10-CM | POA: Insufficient documentation

## 2018-09-15 DIAGNOSIS — R5381 Other malaise: Secondary | ICD-10-CM | POA: Insufficient documentation

## 2018-09-15 DIAGNOSIS — Z8546 Personal history of malignant neoplasm of prostate: Secondary | ICD-10-CM | POA: Diagnosis not present

## 2018-09-15 DIAGNOSIS — D751 Secondary polycythemia: Secondary | ICD-10-CM

## 2018-09-15 DIAGNOSIS — Z79899 Other long term (current) drug therapy: Secondary | ICD-10-CM | POA: Insufficient documentation

## 2018-09-15 DIAGNOSIS — I10 Essential (primary) hypertension: Secondary | ICD-10-CM | POA: Insufficient documentation

## 2018-09-15 DIAGNOSIS — F17210 Nicotine dependence, cigarettes, uncomplicated: Secondary | ICD-10-CM | POA: Diagnosis not present

## 2018-09-15 DIAGNOSIS — C61 Malignant neoplasm of prostate: Secondary | ICD-10-CM

## 2018-09-15 DIAGNOSIS — D649 Anemia, unspecified: Secondary | ICD-10-CM

## 2018-09-15 DIAGNOSIS — N400 Enlarged prostate without lower urinary tract symptoms: Secondary | ICD-10-CM | POA: Diagnosis not present

## 2018-09-15 DIAGNOSIS — Z862 Personal history of diseases of the blood and blood-forming organs and certain disorders involving the immune mechanism: Secondary | ICD-10-CM

## 2018-09-15 LAB — CBC
HEMATOCRIT: 38.2 % — AB (ref 39.0–52.0)
Hemoglobin: 12.6 g/dL — ABNORMAL LOW (ref 13.0–17.0)
MCH: 29.8 pg (ref 26.0–34.0)
MCHC: 33 g/dL (ref 30.0–36.0)
MCV: 90.3 fL (ref 80.0–100.0)
Platelets: 226 10*3/uL (ref 150–400)
RBC: 4.23 MIL/uL (ref 4.22–5.81)
RDW: 14 % (ref 11.5–15.5)
WBC: 5.4 10*3/uL (ref 4.0–10.5)
nRBC: 0 % (ref 0.0–0.2)

## 2018-09-15 LAB — FOLATE: FOLATE: 25 ng/mL (ref >5.9)

## 2018-09-15 LAB — IRON AND TIBC
IRON: 78 ug/dL (ref 45–182)
SATURATION RATIOS: 27 % (ref 17.9–39.5)
TIBC: 285 ug/dL (ref 250–450)
UIBC: 207 ug/dL

## 2018-09-15 LAB — FERRITIN: Ferritin: 227 ng/mL (ref 24–336)

## 2018-09-15 LAB — VITAMIN B12: VITAMIN B 12: 253 pg/mL (ref 180–914)

## 2018-09-15 NOTE — Progress Notes (Signed)
Patient here for polycythemia. Has memory issues. Staes after phlebotomy he feels no different. Appetite is good. Energy is fair. Walks every day. He thinks 1 of his medications causes him to have freq stools.

## 2018-09-16 ENCOUNTER — Telehealth: Payer: Self-pay | Admitting: Urology

## 2018-09-16 NOTE — Telephone Encounter (Signed)
Please call Mr. Espin and have him come in for a PSA and office visit.  He "No Showed" in December.  He has prostate cancer and we need to keep an eye on it.

## 2018-09-16 NOTE — Telephone Encounter (Signed)
Spoke with patient and it looks like he is being followed by his oncologist and is not interested in following up at this time.  Patient seems a little confused on who he is supposed to see, I offered and appointment and declined at this time.

## 2018-09-17 ENCOUNTER — Telehealth: Payer: Self-pay | Admitting: *Deleted

## 2018-09-17 ENCOUNTER — Telehealth: Payer: Self-pay | Admitting: Urology

## 2018-09-17 ENCOUNTER — Other Ambulatory Visit: Payer: Self-pay | Admitting: *Deleted

## 2018-09-17 MED ORDER — VITAMIN B-12 1000 MCG PO TABS: 1000.0000 ug | ORAL_TABLET | Freq: Every day | ORAL | 0 refills | Status: DC

## 2018-09-17 NOTE — Telephone Encounter (Signed)
Called patient today to let him know that Dr. Janese Banks would like him to start taking B 12 1000 mcg 1 tablet once a day.  I told him that his level was on the low normal side and that is why she is recommending him to take this pill.  Patient wanted me to call him back in a few minutes because he felt like he had probably have 1 of those already a bottles of iit at his house.  Called him back and he told me that he did not have any and he took the information down vitamin B 12 1000 mcg, take 1 tablet daily.  He will swing by [facility] tomorrow and pick up the pills and start taking it

## 2018-09-17 NOTE — Telephone Encounter (Signed)
Pt's wife called asking about Lupron shot and I scheduled appt on nurse schedule for 1/20.  Just F.Y.I.

## 2018-09-17 NOTE — Telephone Encounter (Signed)
-----   Message from Sindy Guadeloupe, MD sent at 09/17/2018  8:09 AM EST ----- He needs to take po b12 1000 mcg

## 2018-09-17 NOTE — Progress Notes (Signed)
Hematology/Oncology Consult note [facility]  [phone] [phone]  Patient Care Team: Baxter Hire, MD as PCP - General (Internal Medicine)   Name of the patient: Jerry Haynes  [id number]  [birth year]   Date of visit: 09/17/18  Diagnosis-history of secondary polycythemia  Chief complaint/ Reason for visit-routine follow-up of polycythemia  Heme/Onc history: Patient was initially seen at our practice for evaluation of polycythemia in 2016. At that time he had JAK2 as well as exon12 mutation testing done which was negative. EPO was normal at 5.9. Patient has not had a bone marrow biopsy. CT abdomen back in 2011 did not reveal any evidence of intra-abdominal malignancy that could becontributing to polycythemia. Patient remained asymptomatic but has been requiring intermittent phlebotomy when his hematocrit is more than 60. Last phlebotomy was in December 2017.  Patient's hemoglobin has been steadily coming down since November 2018 from 18.6 and was down to 12.5 in September 2019.  Patient continues to smoke.  He has a history of prostate cancer with biochemical recurrence and was seen by urology and in the past and his last dose of Lupron was in 2018 and he has not had any since then   Interval history-patient reports doing well and he denies any blood loss in his stool or urine.  He has some baseline fatigue.  Denies any exertional shortness of breath.  His weight has remained stable overall.  ECOG PS- 1 Pain scale- 0 Opioid associated constipation- no  Review of systems- Review of Systems  Constitutional: Positive for malaise/fatigue. Negative for chills, fever and weight loss.  HENT: Negative for congestion, ear discharge and nosebleeds.   Eyes: Negative for blurred vision.  Respiratory: Negative for cough, hemoptysis, sputum production, shortness of breath and wheezing.   Cardiovascular: Negative for chest pain, palpitations,  orthopnea and claudication.  Gastrointestinal: Negative for abdominal pain, blood in stool, constipation, diarrhea, heartburn, melena, nausea and vomiting.  Genitourinary: Negative for dysuria, flank pain, frequency, hematuria and urgency.  Musculoskeletal: Negative for back pain, joint pain and myalgias.  Skin: Negative for rash.  Neurological: Negative for dizziness, tingling, focal weakness, seizures, weakness and headaches.  Endo/Heme/Allergies: Does not bruise/bleed easily.  Psychiatric/Behavioral: Negative for depression and suicidal ideas. The patient does not have insomnia.       No Known Allergies   Past Medical History:  Diagnosis Date  . BPH (benign prostatic hyperplasia)   . GERD (gastroesophageal reflux disease)   . Hypercholesteremia   . Hypertension   . Male erectile dysfunction   . Microscopic hematuria   . Polycythemia   . Prostate cancer ([place]) 2008   Gleeson 8, seed implant in 2011  . Urinary frequency      Past Surgical History:  Procedure Laterality Date  . CYSTOSCOPY  03/2013   by Dr. Bernardo Heater  . TRANSURETHRAL RESECTION OF PROSTATE  2015    Social History   Socioeconomic History  . Marital status: Married    Spouse name: Not on file  . Number of children: Not on file  . Years of education: Not on file  . Highest education level: Not on file  Occupational History  . Not on file  Social Needs  . Financial resource strain: Not on file  . Food insecurity:    Worry: Not on file    Inability: Not on file  . Transportation needs:    Medical: Not on file    Non-medical: Not on file  Tobacco Use  .  Smoking status: Current Every Day Smoker    Packs/day: 0.25    Years: 51.00    Pack years: 12.75    Types: Cigarettes  . Smokeless tobacco: Never Used  Substance and Sexual Activity  . Alcohol use: No    Alcohol/week: 0.0 standard drinks  . Drug use: No  . Sexual activity: Never  Lifestyle  . Physical activity:    Days per week: Not on file     Minutes per session: Not on file  . Stress: Not on file  Relationships  . Social connections:    Talks on phone: Not on file    Gets together: Not on file    Attends religious service: Not on file    Active member of club or organization: Not on file    Attends meetings of clubs or organizations: Not on file    Relationship status: Not on file  . Intimate partner violence:    Fear of current or ex partner: Not on file    Emotionally abused: Not on file    Physically abused: Not on file    Forced sexual activity: Not on file  Other Topics Concern  . Not on file  Social History Narrative  . Not on file    Family History  Problem Relation Age of Onset  . Prostate cancer Brother   . Kidney cancer Neg Hx   . Bladder Cancer Neg Hx      Current Outpatient Medications:  .  amLODipine (NORVASC) 5 MG tablet, Take 5 mg by mouth daily., Disp: , Rfl:  .  donepezil (ARICEPT) 5 MG tablet, , Disp: , Rfl:  .  simvastatin (ZOCOR) 20 MG tablet, Take 20 mg by mouth daily at 6 PM., Disp: , Rfl:  .  tamsulosin (FLOMAX) 0.4 MG CAPS capsule, Take 4 mg by mouth daily., Disp: , Rfl:  .  simvastatin (ZOCOR) 20 MG tablet, Take 20 mg by mouth daily at 6 PM. , Disp: , Rfl:   Physical exam:  Vitals:   09/15/18 1114  BP: 96/65  Pulse: 80  Resp: 16  Temp: (!) 97.4 F (36.3 C)  TempSrc: Tympanic  Weight: 146 lb 3.2 oz (66.3 kg)   Physical Exam Constitutional:      Comments: Thin elderly gentleman in no acute distress  HENT:     Head: Normocephalic and atraumatic.  Eyes:     Pupils: Pupils are equal, round, and reactive to light.  Neck:     Musculoskeletal: Normal range of motion.  Cardiovascular:     Rate and Rhythm: Normal rate and regular rhythm.     Heart sounds: Normal heart sounds.  Pulmonary:     Effort: Pulmonary effort is normal.     Breath sounds: Normal breath sounds.  Abdominal:     General: Bowel sounds are normal.     Palpations: Abdomen is soft.  Skin:    General: Skin  is warm and dry.  Neurological:     Mental Status: He is alert and oriented to person, place, and time.      CMP Latest Ref Rng & Units 11/04/2017  Glucose 65 - 99 mg/dL 131(H)  BUN 6 - 20 mg/dL 14  Creatinine 0.61 - 1.24 mg/dL 0.83  Sodium 135 - 145 mmol/L 137  Potassium 3.5 - 5.1 mmol/L 3.5  Chloride 101 - 111 mmol/L 101  CO2 22 - 32 mmol/L 24  Calcium 8.9 - 10.3 mg/dL 9.3   CBC Latest Ref Rng & Units  09/15/2018  WBC 4.0 - 10.5 K/uL 5.4  Hemoglobin 13.0 - 17.0 g/dL 12.6(L)  Hematocrit 39.0 - 52.0 % 38.2(L)  Platelets 150 - 400 K/uL 226     Assessment and plan- Patient is a 78 y.o. male who was initially seen for polycythemia which is no longer an issue  Patient's hemoglobin on 11/19/2016 was between 17-19 and has steadily drifted down to 12 since September 2019.  His hemoglobin is remained stable in the last 4 months.  His iron studies are normal.  B12 levels are low normal at 253 and I will have him take thousand micrograms of p.o. B12 daily.  Folate levels are normal.  If his hemoglobin continues to trend down I will do a complete anemia work-up including haptoglobin LDH and reticulocyte count TSH and myeloma panel.  Repeat CBC with differential in 3 in 6 months and I will see him back in 6 months.  History of prostate cancer with biochemical recurrence: Last dose of Lupron was in 2018 and he has not followed up with urology since then.  I will repeat his PSA in 3 months time and if his PSA continues to trend up I will send him back to urology   Visit Diagnosis 1. Normocytic anemia   2. Prostate cancer [facility])      Dr. Randa Evens, MD, MPH [facility] at [facility] [phone] 09/17/2018 8:25 AM

## 2018-09-17 NOTE — Progress Notes (Signed)
vitamin

## 2018-09-21 ENCOUNTER — Ambulatory Visit (INDEPENDENT_AMBULATORY_CARE_PROVIDER_SITE_OTHER): Payer: Medicare Other

## 2018-09-21 DIAGNOSIS — C61 Malignant neoplasm of prostate: Secondary | ICD-10-CM

## 2018-09-21 MED ORDER — LEUPROLIDE ACETATE (6 MONTH) 45 MG IM KIT
45.0000 mg | PACK | Freq: Once | INTRAMUSCULAR | Status: AC
  Administered 2018-09-21: 45 mg via INTRAMUSCULAR

## 2018-09-21 NOTE — Progress Notes (Signed)
Lupron IM Injection   Due to Prostate Cancer patient is present today for a Lupron Injection.  Medication: Lupron 6 month Dose: 45 mg  Location: right upper outer buttocks Lot: [id number] Exp: 07/25/2020  Patient tolerated well, no complications were noted  Performed by: Shawnie Dapper, CMA  Follow up: 6 months after March 21, 2019

## 2018-09-21 NOTE — Patient Instructions (Addendum)
PLEASE REMEMBER TO TAKE VITAMIN D AND CALCIUM DAILY  Leuprolide depot injection What is this medicine? LEUPROLIDE (loo PROE lide) is a man-made protein that acts like a natural hormone in the body. It decreases testosterone in men. In men, this medicine is used to treat advanced prostate cancer.This medicine may be used for other purposes; ask your health care provider or pharmacist if you have questions. COMMON BRAND NAME(S): Eligard, Lupron Depot, Lupron Depot-Ped, Viadur What should I tell my health care provider before I take this medicine? They need to know if you have any of these conditions: -diabetes -heart disease or previous heart attack -high blood pressure -high cholesterol -mental illness -osteoporosis -pain or difficulty passing urine -seizures -spinal cord metastasis -stroke -suicidal thoughts, plans, or attempt; a previous suicide attempt by you or a family member -tobacco smoker -unusual vaginal bleeding (women) -an unusual or allergic reaction to leuprolide, benzyl alcohol, other medicines, foods, dyes, or preservatives -pregnant or trying to get pregnant -breast-feeding How should I use this medicine? This medicine is for injection into a muscle or for injection under the skin. It is given by a health care professional in a hospital or clinic setting. The specific product will determine how it will be given to you. Make sure you understand which product you receive and how often you will receive it. Talk to your pediatrician regarding the use of this medicine in children. Special care may be needed. Overdosage: If you think you have taken too much of this medicine contact a poison control center or emergency room at once. NOTE: This medicine is only for you. Do not share this medicine with others. What if I miss a dose? It is important not to miss a dose. Call your doctor or health care professional if you are unable to keep an appointment. Depot injections: Depot  injections are given either once-monthly, every 12 weeks, every 16 weeks, or every 24 weeks depending on the product you are prescribed. The product you are prescribed will be based on if you are male or male, and your condition. Make sure you understand your product and dosing. What may interact with this medicine? Do not take this medicine with any of the following medications: -chasteberry This medicine may also interact with the following medications: -herbal or dietary supplements, like black cohosh or DHEA -male hormones, like estrogens or progestins and birth control pills, patches, rings, or injections -male hormones, like testosterone This list may not describe all possible interactions. Give your health care provider a list of all the medicines, herbs, non-prescription drugs, or dietary supplements you use. Also tell them if you smoke, drink alcohol, or use illegal drugs. Some items may interact with your medicine. What should I watch for while using this medicine? Visit your doctor or health care professional for regular checks on your progress. During the first weeks of treatment, your symptoms may get worse, but then will improve as you continue your treatment. You may get hot flashes, increased bone pain, increased difficulty passing urine, or an aggravation of nerve symptoms. Discuss these effects with your doctor or health care professional, some of them may improve with continued use of this medicine. Male patients may experience a menstrual cycle or spotting during the first months of therapy with this medicine. If this continues, contact your doctor or health care professional. What side effects may I notice from receiving this medicine? Side effects that you should report to your doctor or health care professional as soon as possible: -  allergic reactions like skin rash, itching or hives, swelling of the face, lips, or tongue -breathing problems -chest pain -depression or  memory disorders -pain in your legs or groin -pain at site where injected or implanted -seizures -severe headache -swelling of the feet and legs -suicidal thoughts or other mood changes -visual changes -vomiting Side effects that usually do not require medical attention (report to your doctor or health care professional if they continue or are bothersome): -breast swelling or tenderness -decrease in sex drive or performance -diarrhea -hot flashes -loss of appetite -muscle, joint, or bone pains -nausea -redness or irritation at site where injected or implanted -skin problems or acne This list may not describe all possible side effects. Call your doctor for medical advice about side effects. You may report side effects to FDA at 1-800-FDA-1088. Where should I keep my medicine? This drug is given in a hospital or clinic and will not be stored at home. NOTE: This sheet is a summary. It may not cover all possible information. If you have questions about this medicine, talk to your doctor, pharmacist, or health care provider.  2019 Elsevier/Gold Standard (2016-02-01 09:45:53)

## 2018-11-18 ENCOUNTER — Telehealth: Payer: Self-pay | Admitting: Urology

## 2018-11-18 NOTE — Telephone Encounter (Signed)
LUPRON APPROVED 11-18-18 THRU 11-21-19 PA # [id number] 11-17-18 MICHELLE

## 2018-12-14 ENCOUNTER — Other Ambulatory Visit: Payer: Self-pay

## 2018-12-15 ENCOUNTER — Inpatient Hospital Stay: Payer: Medicare Other | Attending: Oncology

## 2018-12-15 ENCOUNTER — Other Ambulatory Visit: Payer: Self-pay

## 2018-12-15 DIAGNOSIS — D649 Anemia, unspecified: Secondary | ICD-10-CM | POA: Diagnosis present

## 2018-12-15 DIAGNOSIS — C61 Malignant neoplasm of prostate: Secondary | ICD-10-CM | POA: Diagnosis not present

## 2018-12-15 LAB — CBC WITH DIFFERENTIAL/PLATELET
Abs Immature Granulocytes: 0.01 10*3/uL (ref 0.00–0.07)
Basophils Absolute: 0 10*3/uL (ref 0.0–0.1)
Basophils Relative: 0 %
Eosinophils Absolute: 0.1 10*3/uL (ref 0.0–0.5)
Eosinophils Relative: 2 %
HCT: 38.6 % — ABNORMAL LOW (ref 39.0–52.0)
Hemoglobin: 13 g/dL (ref 13.0–17.0)
Immature Granulocytes: 0 %
Lymphocytes Relative: 23 %
Lymphs Abs: 1.3 10*3/uL (ref 0.7–4.0)
MCH: 30.3 pg (ref 26.0–34.0)
MCHC: 33.7 g/dL (ref 30.0–36.0)
MCV: 90 fL (ref 80.0–100.0)
Monocytes Absolute: 0.5 10*3/uL (ref 0.1–1.0)
Monocytes Relative: 9 %
Neutro Abs: 3.7 10*3/uL (ref 1.7–7.7)
Neutrophils Relative %: 66 %
Platelets: 244 10*3/uL (ref 150–400)
RBC: 4.29 MIL/uL (ref 4.22–5.81)
RDW: 13.9 % (ref 11.5–15.5)
WBC: 5.7 10*3/uL (ref 4.0–10.5)
nRBC: 0 % (ref 0.0–0.2)

## 2018-12-15 LAB — PSA: Prostatic Specific Antigen: 0.19 ng/mL (ref 0.00–4.00)

## 2019-01-30 IMAGING — CT CT HEAD W/O CM
3 series · 14 of 46 positions shown, 16 images · non-contrast
Comparison: None.

CLINICAL DATA: Seizure.  History of prostate cancer.

EXAM:
CT HEAD WITHOUT CONTRAST
TECHNIQUE: Contiguous axial images were obtained from the base of the skull
through the vertex without intravenous contrast.

[Series 2: head wo · axial · 0.45mm/px · z∈[-139,-19]mm · 8 of 29 slices shown, 10 images]
[im 3/29  brain]
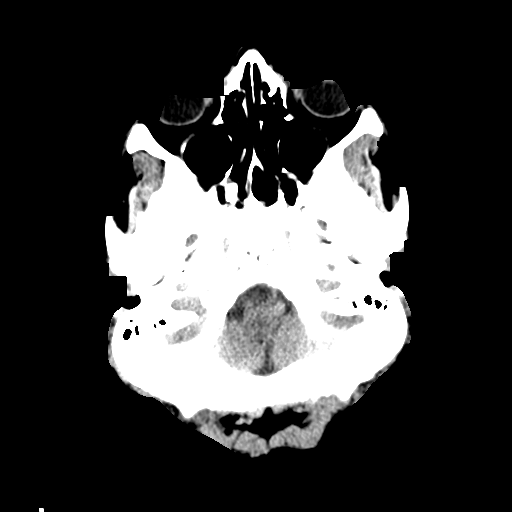
[im 3/29  bone]
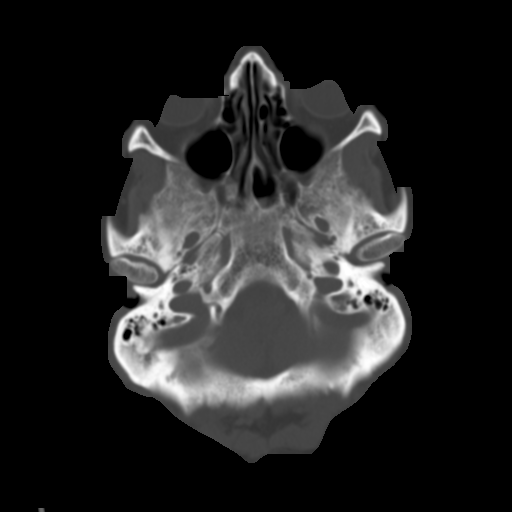
[im 7/29  brain]
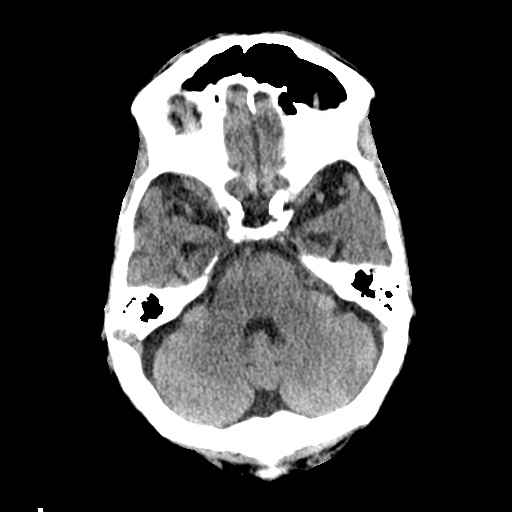
[im 10/29  brain]
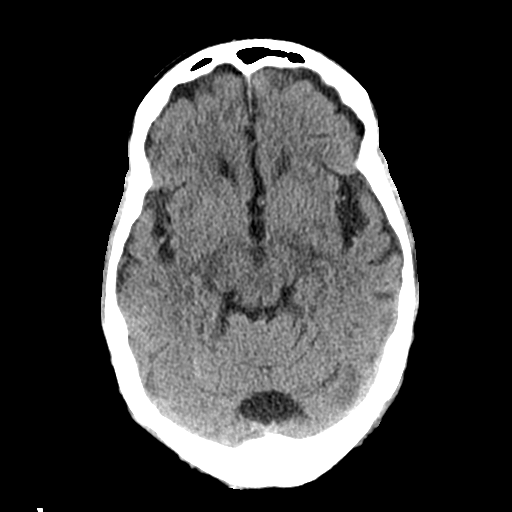
[im 13/29  brain]
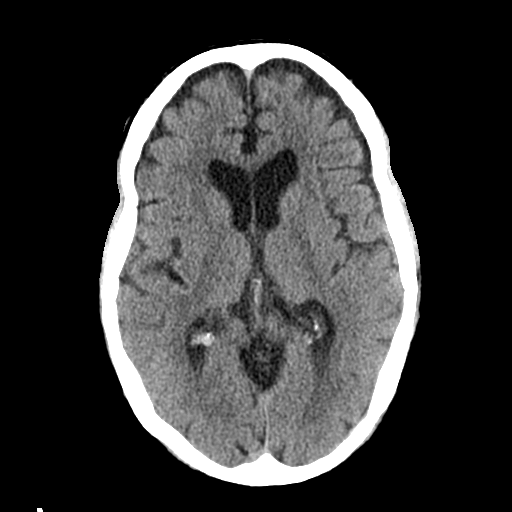
[im 17/29  brain]
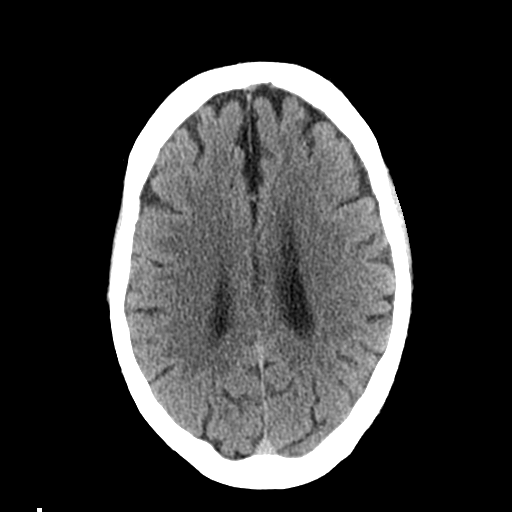
[im 17/29  bone]
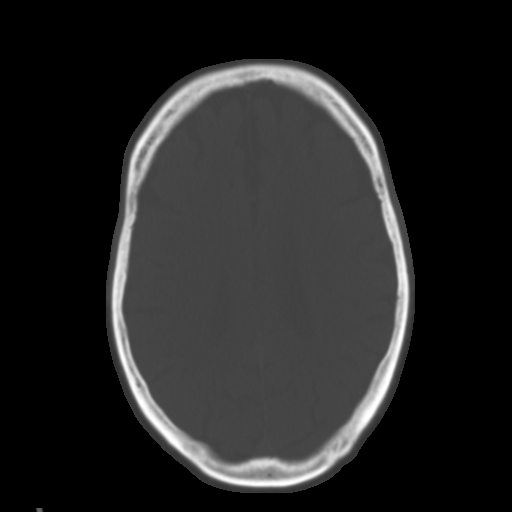
[im 20/29  brain]
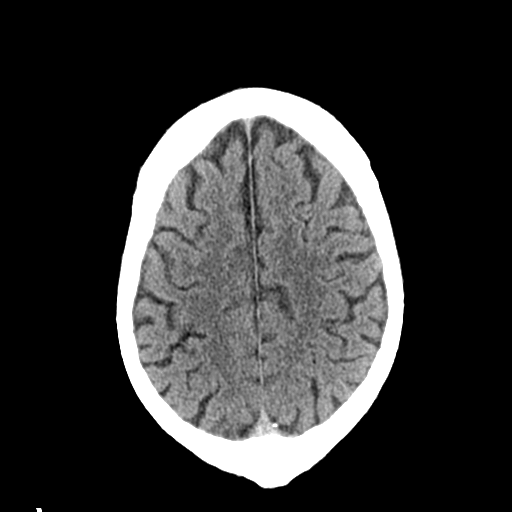
[im 23/29  brain]
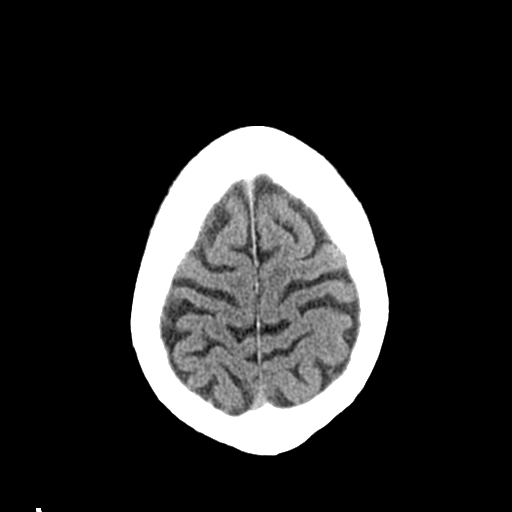
[im 27/29  brain]
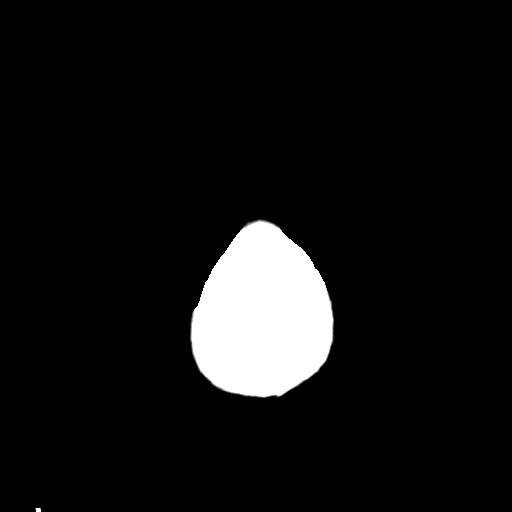

[Series 4: coronal soft tissue · coronal · 0.29mm/px · 3 of 70 slices shown]
[im 24/70  brain]
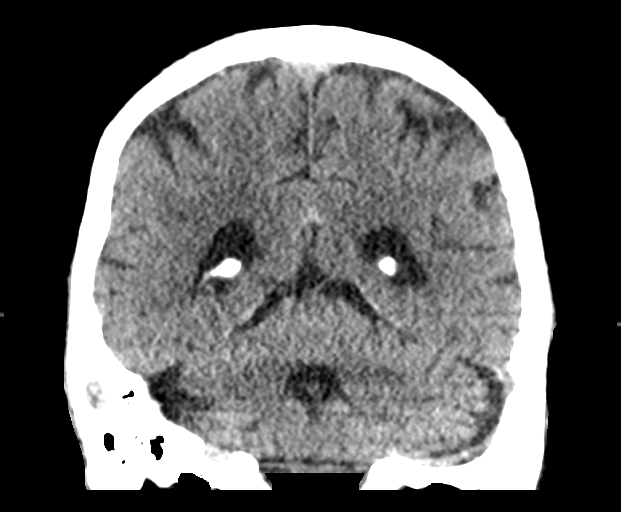
[im 31/70  brain]
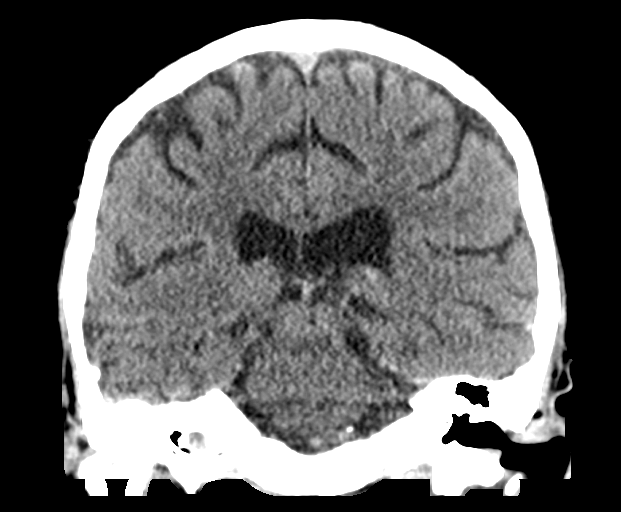
[im 39/70  brain]
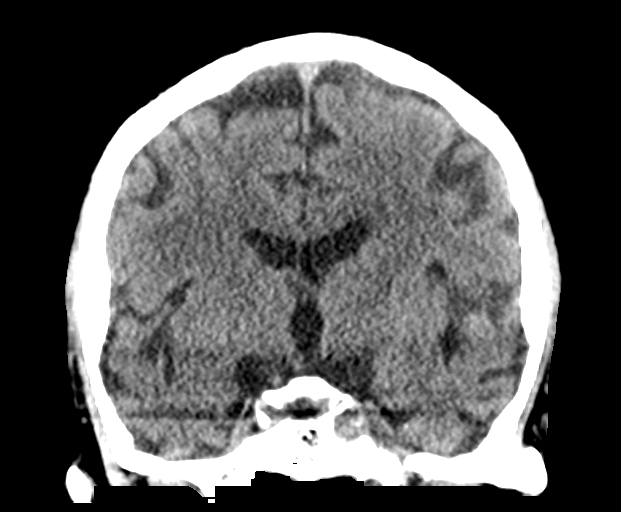

[Series 5: sagittal soft tissue · sagittal · 0.30mm/px · 3 of 52 slices shown]
[im 18/52  brain]
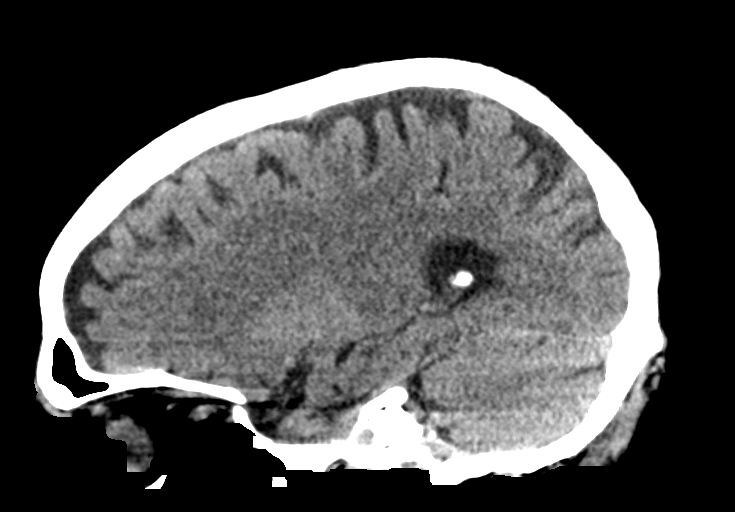
[im 26/52  brain]
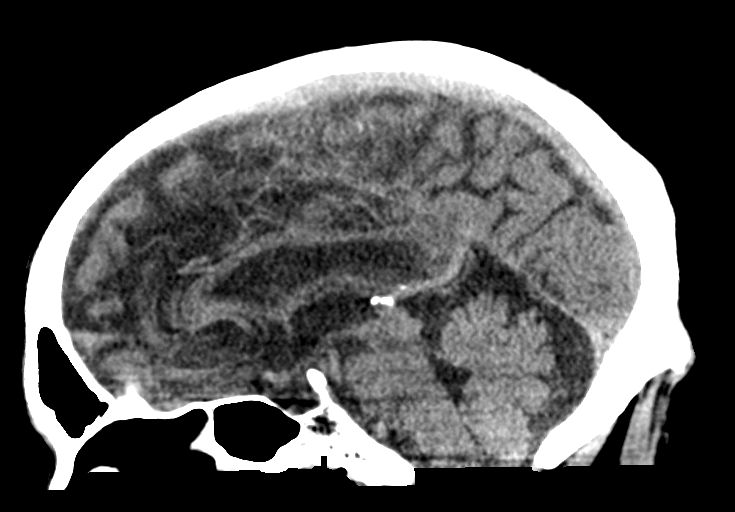
[im 35/52  brain]
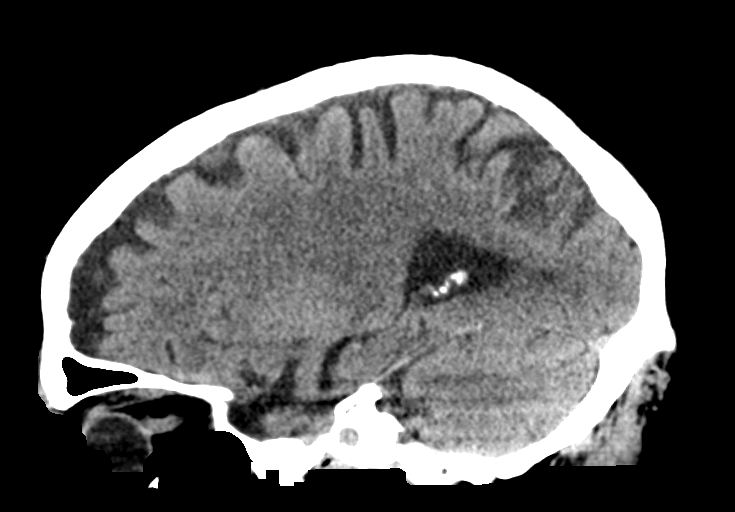

[14 of 46 positions shown; findings below may reference images not displayed]

FINDINGS: Brain: No evidence of parenchymal hemorrhage or extra-axial fluid
collection. No mass lesion, mass effect, or midline shift. No CT
evidence of acute infarction. Generalized cerebral volume loss.
Nonspecific mild subcortical and periventricular white matter
hypodensity, most in keeping with chronic small vessel ischemic
change. No ventriculomegaly.

Vascular: No acute abnormality.

Skull: No evidence of calvarial fracture.

Sinuses/Orbits: The visualized paranasal sinuses are essentially
clear.

Other: Partial inferior right mastoid effusion. Clear left mastoid
air cells.
IMPRESSION: 1. No evidence of acute intracranial abnormality. No evidence of
calvarial fracture.
2. Generalized cerebral volume loss and mild chronic small vessel
ischemic changes in the cerebral white matter.
3. Nonspecific partial right mastoid effusion.

## 2019-03-15 ENCOUNTER — Other Ambulatory Visit: Payer: Self-pay

## 2019-03-16 ENCOUNTER — Inpatient Hospital Stay: Payer: Medicare Other | Attending: Oncology

## 2019-03-16 ENCOUNTER — Other Ambulatory Visit: Payer: Self-pay

## 2019-03-16 ENCOUNTER — Encounter: Payer: Self-pay | Admitting: Oncology

## 2019-03-16 ENCOUNTER — Inpatient Hospital Stay (HOSPITAL_BASED_OUTPATIENT_CLINIC_OR_DEPARTMENT_OTHER): Payer: Medicare Other | Admitting: Oncology

## 2019-03-16 VITALS — BP 120/86 | HR 78 | Temp 98.9°F | Wt 145.7 lb

## 2019-03-16 DIAGNOSIS — F17210 Nicotine dependence, cigarettes, uncomplicated: Secondary | ICD-10-CM

## 2019-03-16 DIAGNOSIS — E7800 Pure hypercholesterolemia, unspecified: Secondary | ICD-10-CM

## 2019-03-16 DIAGNOSIS — C61 Malignant neoplasm of prostate: Secondary | ICD-10-CM

## 2019-03-16 DIAGNOSIS — D649 Anemia, unspecified: Secondary | ICD-10-CM | POA: Insufficient documentation

## 2019-03-16 DIAGNOSIS — Z79899 Other long term (current) drug therapy: Secondary | ICD-10-CM

## 2019-03-16 DIAGNOSIS — I10 Essential (primary) hypertension: Secondary | ICD-10-CM | POA: Diagnosis not present

## 2019-03-16 LAB — CBC WITH DIFFERENTIAL/PLATELET
Abs Immature Granulocytes: 0.02 10*3/uL (ref 0.00–0.07)
Basophils Absolute: 0 10*3/uL (ref 0.0–0.1)
Basophils Relative: 0 %
Eosinophils Absolute: 0.1 10*3/uL (ref 0.0–0.5)
Eosinophils Relative: 2 %
HCT: 36.9 % — ABNORMAL LOW (ref 39.0–52.0)
Hemoglobin: 12.4 g/dL — ABNORMAL LOW (ref 13.0–17.0)
Immature Granulocytes: 0 %
Lymphocytes Relative: 18 %
Lymphs Abs: 1.4 10*3/uL (ref 0.7–4.0)
MCH: 30.6 pg (ref 26.0–34.0)
MCHC: 33.6 g/dL (ref 30.0–36.0)
MCV: 91.1 fL (ref 80.0–100.0)
Monocytes Absolute: 0.5 10*3/uL (ref 0.1–1.0)
Monocytes Relative: 7 %
Neutro Abs: 5.6 10*3/uL (ref 1.7–7.7)
Neutrophils Relative %: 73 %
Platelets: 234 10*3/uL (ref 150–400)
RBC: 4.05 MIL/uL — ABNORMAL LOW (ref 4.22–5.81)
RDW: 13.5 % (ref 11.5–15.5)
WBC: 7.7 10*3/uL (ref 4.0–10.5)
nRBC: 0 % (ref 0.0–0.2)

## 2019-03-16 LAB — IRON AND TIBC
Iron: 40 ug/dL — ABNORMAL LOW (ref 45–182)
Saturation Ratios: 14 % — ABNORMAL LOW (ref 17.9–39.5)
TIBC: 286 ug/dL (ref 250–450)
UIBC: 246 ug/dL

## 2019-03-16 LAB — FERRITIN: Ferritin: 239 ng/mL (ref 24–336)

## 2019-03-16 LAB — PSA: Prostatic Specific Antigen: 0.1 ng/mL (ref 0.00–4.00)

## 2019-03-16 LAB — VITAMIN B12: Vitamin B-12: 299 pg/mL (ref 180–914)

## 2019-03-16 NOTE — Progress Notes (Signed)
Hematology/Oncology Consult note [facility]  [phone] [phone]  Patient Care Team: Baxter Hire, MD as PCP - General (Internal Medicine)   Name of the patient: Jerry Haynes  [id number]  10-Oct-1940   Date of visit: 03/16/19  Diagnosis- 1.  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 At that time he had JAK2 as well as exon12 mutation testing done which was negative. EPO was normal at 5.9. Patient has not had a bone marrow biopsy. CT abdomen back in 2011 did not reveal any evidence of intra-abdominal malignancy that could becontributing to polycythemia. Patient remained asymptomatic but has been requiring intermittent phlebotomy when his hematocrit is more than 60. Last phlebotomy was in December 2017.  Patient's hemoglobin has been steadily coming down since November 2018 from 18.6 and was down to 12.5 in September 2019.  Patient continues to smoke.  He has a history of prostate cancer with biochemical recurrence and follows up with urology  Interval history- overall patient feels well and denies any complaints at this time.  His appetite and weight have remained stable  ECOG PS- 1 Pain scale- 0   Review of systems- Review of Systems  Constitutional: Negative for chills, fever, malaise/fatigue and weight loss.  HENT: Negative for congestion, ear discharge and nosebleeds.   Eyes: Negative for blurred vision.  Respiratory: Negative for cough, hemoptysis, sputum production, shortness of breath and wheezing.   Cardiovascular: Negative for chest pain, palpitations, orthopnea and claudication.  Gastrointestinal: Negative for abdominal pain, blood in stool, constipation, diarrhea, heartburn, melena,  nausea and vomiting.  Genitourinary: Negative for dysuria, flank pain, frequency, hematuria and urgency.  Musculoskeletal: Negative for back pain, joint pain and myalgias.  Skin: Negative for rash.  Neurological: Negative for dizziness, tingling, focal weakness, seizures, weakness and headaches.  Endo/Heme/Allergies: Does not bruise/bleed easily.  Psychiatric/Behavioral: Negative for depression and suicidal ideas. The patient does not have insomnia.       No Known Allergies   Past Medical History:  Diagnosis Date  . BPH (benign prostatic hyperplasia)   . GERD (gastroesophageal reflux disease)   . Hypercholesteremia   . Hypertension   . Male erectile dysfunction   . Microscopic hematuria   . Polycythemia   . Prostate cancer ([place]) 2008   Gleeson 8, seed implant in 2011  . Urinary frequency      Past Surgical History:  Procedure Laterality Date  . CYSTOSCOPY  03/2013   by Dr. Bernardo Heater  . TRANSURETHRAL RESECTION OF PROSTATE  2015    Social History   Socioeconomic History  . Marital status: Married    Spouse name: Not on file  . Number of children: Not on file  . Years of education: Not on file  . Highest education level: Not on file  Occupational History  . Not on file  Social Needs  . Financial resource strain: Not on file  . Food insecurity    Worry: Not on file    Inability: Not on file  . Transportation needs    Medical: Not on file    Non-medical: Not on file  Tobacco Use  . Smoking status: Current Every Day Smoker    Packs/day: 0.25    Years: 51.00    Pack years: 12.75  Types: Cigarettes  . Smokeless tobacco: Never Used  Substance and Sexual Activity  . Alcohol use: No    Alcohol/week: 0.0 standard drinks  . Drug use: No  . Sexual activity: Never  Lifestyle  . Physical activity    Days per week: Not on file    Minutes per session: Not on file  . Stress: Not on file  Relationships  . Social Herbalist on phone: Not on file    Gets  together: Not on file    Attends religious service: Not on file    Active member of club or organization: Not on file    Attends meetings of clubs or organizations: Not on file    Relationship status: Not on file  . Intimate partner violence    Fear of current or ex partner: Not on file    Emotionally abused: Not on file    Physically abused: Not on file    Forced sexual activity: Not on file  Other Topics Concern  . Not on file  Social History Narrative  . Not on file    Family History  Problem Relation Age of Onset  . Prostate cancer Brother   . Kidney cancer Neg Hx   . Bladder Cancer Neg Hx      Current Outpatient Medications:  .  amLODipine (NORVASC) 5 MG tablet, Take 5 mg by mouth daily., Disp: , Rfl:  .  donepezil (ARICEPT) 5 MG tablet, , Disp: , Rfl:  .  simvastatin (ZOCOR) 20 MG tablet, Take 20 mg by mouth daily at 6 PM., Disp: , Rfl:  .  tamsulosin (FLOMAX) 0.4 MG CAPS capsule, Take 4 mg by mouth daily., Disp: , Rfl:  .  simvastatin (ZOCOR) 20 MG tablet, Take 20 mg by mouth daily at 6 PM. , Disp: , Rfl:  .  vitamin B-12 (CYANOCOBALAMIN) 1000 MCG tablet, Take 1 tablet (1,000 mcg total) by mouth daily. (Patient not taking: Reported on 03/16/2019), Disp: 30 tablet, Rfl: 0  Physical exam:  Vitals:   03/16/19 1013  BP: 120/86  Pulse: 78  Temp: 98.9 F (37.2 C)  TempSrc: Tympanic  Weight: 145 lb 11.2 oz (66.1 kg)   Physical Exam Constitutional:      General: He is not in acute distress. HENT:     Head: Normocephalic and atraumatic.  Eyes:     Pupils: Pupils are equal, round, and reactive to light.  Neck:     Musculoskeletal: Normal range of motion.  Cardiovascular:     Rate and Rhythm: Normal rate and regular rhythm.     Heart sounds: Normal heart sounds.  Pulmonary:     Effort: Pulmonary effort is normal.     Breath sounds: Normal breath sounds.  Abdominal:     General: Bowel sounds are normal.     Palpations: Abdomen is soft.  Skin:    General: Skin is  warm and dry.  Neurological:     Mental Status: He is alert and oriented to person, place, and time.      CMP Latest Ref Rng & Units 11/04/2017  Glucose 65 - 99 mg/dL 131(H)  BUN 6 - 20 mg/dL 14  Creatinine 0.61 - 1.24 mg/dL 0.83  Sodium 135 - 145 mmol/L 137  Potassium 3.5 - 5.1 mmol/L 3.5  Chloride 101 - 111 mmol/L 101  CO2 22 - 32 mmol/L 24  Calcium 8.9 - 10.3 mg/dL 9.3   CBC Latest Ref Rng & Units 03/16/2019  WBC  4.0 - 10.5 K/uL 7.7  Hemoglobin 13.0 - 17.0 g/dL 12.4(L)  Hematocrit 39.0 - 52.0 % 36.9(L)  Platelets 150 - 400 K/uL 234      Assessment and plan- Patient is a 78 y.o. male with following issues:  1.  Normocytic anemia: Possibly secondary to ADT.  He had polycythemia before and required phlebotomy but that was back in 2017.  Currently his hemoglobin remained stable around 12.  Continue to monitor  2.  Prostate cancer: Again encouraged him to follow-up with urology and has an appointment coming up with them next week  I will see him back in 6 months with CBC with differential, iron studies and B12   Visit Diagnosis 1. Normocytic anemia      Dr. Randa Evens, MD, MPH [facility] at [facility] [phone] 03/16/2019 2:30 PM

## 2019-03-22 ENCOUNTER — Ambulatory Visit (INDEPENDENT_AMBULATORY_CARE_PROVIDER_SITE_OTHER): Payer: Medicare Other | Admitting: Family Medicine

## 2019-03-22 ENCOUNTER — Other Ambulatory Visit: Payer: Self-pay

## 2019-03-22 DIAGNOSIS — C61 Malignant neoplasm of prostate: Secondary | ICD-10-CM

## 2019-03-22 MED ORDER — LEUPROLIDE ACETATE (6 MONTH) 45 MG IM KIT
45.0000 mg | PACK | Freq: Once | INTRAMUSCULAR | Status: AC
  Administered 2019-03-22: 45 mg via INTRAMUSCULAR

## 2019-03-22 NOTE — Progress Notes (Signed)
Lupron IM Injection   Due to Prostate Cancer patient is present today for a Lupron Injection.  Medication: Lupron 6 month Dose: 45 mg  Location: right upper outer buttocks Lot: [id number] Exp: 04/13/2021  Patient tolerated well, no complications were noted  Performed by: Elberta Leatherwood, CMA  Follow up: 6 months

## 2019-09-15 NOTE — Progress Notes (Deleted)
No answer left message.

## 2019-09-16 ENCOUNTER — Inpatient Hospital Stay: Payer: Medicare Other

## 2019-09-16 ENCOUNTER — Inpatient Hospital Stay: Payer: Medicare Other | Admitting: Oncology

## 2019-09-22 ENCOUNTER — Other Ambulatory Visit: Payer: Self-pay

## 2019-09-22 ENCOUNTER — Ambulatory Visit (INDEPENDENT_AMBULATORY_CARE_PROVIDER_SITE_OTHER): Payer: Medicare Other | Admitting: Family Medicine

## 2019-09-22 DIAGNOSIS — C61 Malignant neoplasm of prostate: Secondary | ICD-10-CM | POA: Diagnosis not present

## 2019-09-22 MED ORDER — LEUPROLIDE ACETATE (6 MONTH) 45 MG ~~LOC~~ KIT
45.0000 mg | PACK | Freq: Once | SUBCUTANEOUS | Status: AC
  Administered 2019-09-22: 45 mg via SUBCUTANEOUS

## 2019-09-22 NOTE — Progress Notes (Signed)
Eligard SubQ Injection   Due to Prostate Cancer patient is present today for a Eligard Injection.  Medication: Eligard 6 month Dose: 45 mg  Location: right  Lot: [id number] Exp: 06/2021  Patient tolerated well, no complications were noted  Performed by: Elberta Leatherwood, [place]  Per Dr. Junious Silk patient is to continue therapy for 6 month . Patient's next follow up was scheduled for . This appointment was scheduled using wheel and given to patient today along with reminder continue on Vitamin D 800-1000iu and Calium 1000-1200mg  daily while on Androgen Deprivation Therapy.

## 2019-09-23 ENCOUNTER — Telehealth: Payer: Self-pay | Admitting: *Deleted

## 2019-09-23 LAB — PSA: Prostate Specific Ag, Serum: <0.1 ng/mL (ref 0.0–4.0)

## 2019-09-23 LAB — TESTOSTERONE: Testosterone: <3 ng/dL — ABNORMAL LOW (ref 264–916)

## 2019-09-23 NOTE — Telephone Encounter (Signed)
-----   Message from Festus Aloe, MD sent at 09/23/2019  7:42 AM EST ----- Let patient know his PSA is undetectable and I need to see him back for office visit to manage the prostate cancer. Next available appt is fine. His oncologist is looking at his blood but not prostate cancer. In oncology note it says to f/u with urology.  ----- Message ----- From: Chrystie Nose, CMA Sent: 09/23/2019   7:26 AM EST To: Festus Aloe, MD   ----- Message ----- From: Lavone Neri Lab Results In Sent: 09/23/2019   5:39 AM EST To: [facility]

## 2019-09-23 NOTE — Telephone Encounter (Signed)
Notified patient daughter  as instructed, patient pleased. Discussed follow-up appointments, patient agrees  

## 2019-09-23 NOTE — Telephone Encounter (Signed)
Pt called and is returning your call.

## 2019-09-24 ENCOUNTER — Other Ambulatory Visit: Payer: Self-pay

## 2019-09-24 DIAGNOSIS — D649 Anemia, unspecified: Secondary | ICD-10-CM

## 2019-09-27 ENCOUNTER — Telehealth: Payer: Self-pay

## 2019-09-27 ENCOUNTER — Inpatient Hospital Stay: Payer: Medicare Other

## 2019-09-27 ENCOUNTER — Inpatient Hospital Stay: Payer: Medicare Other | Attending: Oncology | Admitting: Oncology

## 2019-09-27 ENCOUNTER — Encounter: Payer: Self-pay | Admitting: Oncology

## 2019-09-27 ENCOUNTER — Other Ambulatory Visit: Payer: Self-pay

## 2019-09-27 VITALS — BP 93/75 | HR 76 | Temp 97.5°F | Resp 16 | Ht 73.0 in | Wt 154.4 lb

## 2019-09-27 DIAGNOSIS — D649 Anemia, unspecified: Secondary | ICD-10-CM | POA: Insufficient documentation

## 2019-09-27 DIAGNOSIS — I10 Essential (primary) hypertension: Secondary | ICD-10-CM | POA: Diagnosis not present

## 2019-09-27 DIAGNOSIS — F17210 Nicotine dependence, cigarettes, uncomplicated: Secondary | ICD-10-CM | POA: Diagnosis not present

## 2019-09-27 DIAGNOSIS — C61 Malignant neoplasm of prostate: Secondary | ICD-10-CM | POA: Diagnosis not present

## 2019-09-27 DIAGNOSIS — E7800 Pure hypercholesterolemia, unspecified: Secondary | ICD-10-CM | POA: Insufficient documentation

## 2019-09-27 DIAGNOSIS — Z79899 Other long term (current) drug therapy: Secondary | ICD-10-CM | POA: Insufficient documentation

## 2019-09-27 LAB — CBC WITH DIFFERENTIAL/PLATELET
Abs Immature Granulocytes: 0.01 10*3/uL (ref 0.00–0.07)
Basophils Absolute: 0 10*3/uL (ref 0.0–0.1)
Basophils Relative: 1 %
Eosinophils Absolute: 0.1 10*3/uL (ref 0.0–0.5)
Eosinophils Relative: 2 %
HCT: 38 % — ABNORMAL LOW (ref 39.0–52.0)
Hemoglobin: 12.5 g/dL — ABNORMAL LOW (ref 13.0–17.0)
Immature Granulocytes: 0 %
Lymphocytes Relative: 22 %
Lymphs Abs: 1.5 10*3/uL (ref 0.7–4.0)
MCH: 30.1 pg (ref 26.0–34.0)
MCHC: 32.9 g/dL (ref 30.0–36.0)
MCV: 91.6 fL (ref 80.0–100.0)
Monocytes Absolute: 0.5 10*3/uL (ref 0.1–1.0)
Monocytes Relative: 8 %
Neutro Abs: 4.4 10*3/uL (ref 1.7–7.7)
Neutrophils Relative %: 67 %
Platelets: 234 10*3/uL (ref 150–400)
RBC: 4.15 MIL/uL — ABNORMAL LOW (ref 4.22–5.81)
RDW: 14.1 % (ref 11.5–15.5)
WBC: 6.5 10*3/uL (ref 4.0–10.5)
nRBC: 0 % (ref 0.0–0.2)

## 2019-09-27 LAB — IRON AND TIBC
Iron: 59 ug/dL (ref 45–182)
Saturation Ratios: 22 % (ref 17.9–39.5)
TIBC: 273 ug/dL (ref 250–450)
UIBC: 214 ug/dL

## 2019-09-27 LAB — VITAMIN B12: Vitamin B-12: 214 pg/mL (ref 180–914)

## 2019-09-27 LAB — FERRITIN: Ferritin: 201 ng/mL (ref 24–336)

## 2019-09-27 NOTE — Telephone Encounter (Signed)
-----   Message from Benard Halsted sent at 09/22/2019 10:09 AM EST ----- Regarding: Eligard PA FYI I scheduled this patient for a 6 month Eligard injection for 03-21-20 and it will need a new PA.  Thanks, Sharyn Lull

## 2019-09-27 NOTE — Telephone Encounter (Signed)
PA for Eligard initiated. Awaiting response.

## 2019-09-27 NOTE — Progress Notes (Signed)
Pt does not remember his med. He has 4 bottles and he only takes 2 of them. He states someone told him to stop his b/p med and he was not sure if it was his PCP.I told him that he needs to call Dr. Gilford Rile office and ask. He lives with wife so he said she will figure it out

## 2019-09-28 ENCOUNTER — Telehealth: Payer: Self-pay

## 2019-09-28 NOTE — Telephone Encounter (Signed)
Called patient and told him that his vitamin B12 was low and that Dr. Janese Banks recommended to start taking Vitamin B12 100 mcg daily. Patient stated that he would start taking them.

## 2019-09-28 NOTE — Telephone Encounter (Signed)
-----   Message from Sindy Guadeloupe, MD sent at 09/28/2019  8:14 AM EST ----- He needs to take po b12 1000 mcg daily

## 2019-09-28 NOTE — Progress Notes (Signed)
Hematology/Oncology Consult note [facility]  [phone] [phone]  Patient Care Team: Baxter Hire, MD as PCP - General (Internal Medicine)   Name of the patient: Jerry Haynes  [id number]  Apr 24, 1941   Date of visit: 09/28/19  Diagnosis- 1.  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 At that time he had JAK2 as well as exon12 mutation testing done which was negative. EPO was normal at 5.9. Patient has not had a bone marrow biopsy. CT abdomen back in 2011 did not reveal any evidence of intra-abdominal malignancy that could becontributing to polycythemia. Patient remained asymptomatic but has been requiring intermittent phlebotomy when his hematocrit is more than 60. Last phlebotomy was in December 2017. Patient's hemoglobin has been steadily coming down since November 2018 from 18.6 and was down to 12.5 in September 2019. Patient continues to smoke. He has a history of prostate cancer with biochemical recurrence and follows up with urology  Interval history-patient feels well and denies any complaints at this time.  He follows up with urology for his prostate cancer.  ECOG PS- 1 Pain scale- 0   Review of systems- Review of Systems  Constitutional: Negative for chills, fever, malaise/fatigue and weight loss.  HENT: Negative for congestion, ear discharge and nosebleeds.   Eyes: Negative for blurred vision.  Respiratory: Negative for cough, hemoptysis, sputum production, shortness of breath and wheezing.   Cardiovascular: Negative for chest pain, palpitations, orthopnea and claudication.  Gastrointestinal: Negative for abdominal pain, blood in stool, constipation, diarrhea, heartburn,  melena, nausea and vomiting.  Genitourinary: Negative for dysuria, flank pain, frequency, hematuria and urgency.  Musculoskeletal: Negative for back pain, joint pain and myalgias.  Skin: Negative for rash.  Neurological: Negative for dizziness, tingling, focal weakness, seizures, weakness and headaches.  Endo/Heme/Allergies: Does not bruise/bleed easily.  Psychiatric/Behavioral: Negative for depression and suicidal ideas. The patient does not have insomnia.       No Known Allergies   Past Medical History:  Diagnosis Date  . BPH (benign prostatic hyperplasia)   . GERD (gastroesophageal reflux disease)   . Hypercholesteremia   . Hypertension   . Male erectile dysfunction   . Microscopic hematuria   . Normocytic anemia   . Polycythemia   . Prostate cancer ([place]) 2008   Gleeson 8, seed implant in 2011  . Urinary frequency      Past Surgical History:  Procedure Laterality Date  . CYSTOSCOPY  03/2013   by Dr. Bernardo Heater  . TRANSURETHRAL RESECTION OF PROSTATE  2015    Social History   Socioeconomic History  . Marital status: Married    Spouse name: Not on file  . Number of children: Not on file  . Years of education: Not on file  . Highest education level: Not on file  Occupational History  . Not on file  Tobacco Use  . Smoking status: Current Every Day Smoker    Packs/day: 0.25    Years: 51.00    Pack years: 12.75    Types: Cigarettes  . Smokeless tobacco: Never Used  Substance and Sexual Activity  . Alcohol use: No    Alcohol/week: 0.0 standard drinks  . Drug use: No  . Sexual activity: Never  Other Topics Concern  . Not on file  Social History Narrative  . Not on file   Social Determinants of Health   Financial Resource Strain:   . Difficulty of Paying Living Expenses: Not on file  Food Insecurity:   . Worried About Charity fundraiser in the Last Year: Not on file  . Ran Out of Food in the Last Year: Not on file  Transportation Needs:   . Lack of  Transportation (Medical): Not on file  . Lack of Transportation (Non-Medical): Not on file  Physical Activity:   . Days of Exercise per Week: Not on file  . Minutes of Exercise per Session: Not on file  Stress:   . Feeling of Stress : Not on file  Social Connections:   . Frequency of Communication with Friends and Family: Not on file  . Frequency of Social Gatherings with Friends and Family: Not on file  . Attends Religious Services: Not on file  . Active Member of Clubs or Organizations: Not on file  . Attends Archivist Meetings: Not on file  . Marital Status: Not on file  Intimate Partner Violence:   . Fear of Current or Ex-Partner: Not on file  . Emotionally Abused: Not on file  . Physically Abused: Not on file  . Sexually Abused: Not on file    Family History  Problem Relation Age of Onset  . Prostate cancer Brother   . Kidney cancer Neg Hx   . Bladder Cancer Neg Hx      Current Outpatient Medications:  .  amLODipine (NORVASC) 5 MG tablet, Take 5 mg by mouth daily., Disp: , Rfl:  .  donepezil (ARICEPT) 5 MG tablet, , Disp: , Rfl:  .  simvastatin (ZOCOR) 20 MG tablet, Take 20 mg by mouth daily at 6 PM., Disp: , Rfl:  .  tamsulosin (FLOMAX) 0.4 MG CAPS capsule, Take 4 mg by mouth daily., Disp: , Rfl:  .  vitamin B-12 (CYANOCOBALAMIN) 1000 MCG tablet, Take 1 tablet (1,000 mcg total) by mouth daily. (Patient not taking: Reported on 03/16/2019), Disp: 30 tablet, Rfl: 0  Physical exam:  Vitals:   09/27/19 1443  BP: 93/75  Pulse: 76  Resp: 16  Temp: (!) 97.5 F (36.4 C)  TempSrc: Tympanic  Weight: 154 lb 6.4 oz (70 kg)  Height: _0  (1.854 m)   Physical Exam Constitutional:      General: He is not in acute distress. HENT:     Head: Normocephalic and atraumatic.  Eyes:     Pupils: Pupils are equal, round, and reactive to light.  Cardiovascular:     Rate and Rhythm: Normal rate and regular rhythm.     Heart sounds: Normal heart sounds.  Pulmonary:      Effort: Pulmonary effort is normal.     Breath sounds: Normal breath sounds.  Abdominal:     General: Bowel sounds are normal.     Palpations: Abdomen is soft.  Musculoskeletal:     Cervical back: Normal range of motion.  Skin:    General: Skin is warm and dry.  Neurological:     Mental Status: He is alert and oriented to person, place, and time.      CMP Latest Ref Rng & Units 11/04/2017  Glucose 65 - 99 mg/dL 131(H)  BUN 6 - 20 mg/dL 14  Creatinine 0.61 - 1.24 mg/dL 0.83  Sodium 135 - 145 mmol/L 137  Potassium 3.5 - 5.1 mmol/L 3.5  Chloride 101 - 111 mmol/L 101  CO2 22 -  32 mmol/L 24  Calcium 8.9 - 10.3 mg/dL 9.3   CBC Latest Ref Rng & Units 09/27/2019  WBC 4.0 - 10.5 K/uL 6.5  Hemoglobin 13.0 - 17.0 g/dL 12.5(L)  Hematocrit 39.0 - 52.0 % 38.0(L)  Platelets 150 - 400 K/uL 234      Assessment and plan- Patient is a 79 y.o. male with normocytic anemia here for routine follow-up  Patient in the past had secondary polycythemia from smoking.  Ever since he started ADT he has developed secondary anemia likely from it and his hemoglobin is remained stable around 12Iron studies and B12 and folate are normal.  His hemoglobin has been around the same range for the last 1-1/2-year.  He therefore does not require hematology follow-up at this time and can be referred to [place] in the future if any questions or concerns arise.  He will continue to follow-up with urology for his prostate cancer and he last received his Lupron a week ago.    Visit Diagnosis 1. Normocytic anemia      Dr. Randa Evens, MD, MPH [facility] [phone] 09/28/2019 12:25 PM

## 2019-10-06 NOTE — Telephone Encounter (Signed)
Incoming fax from [place] with approval of Eligard injection. Case ID [id number] Effective 09/03/2019 - 12/31/202. Pt scheduled.

## 2019-10-08 ENCOUNTER — Ambulatory Visit (INDEPENDENT_AMBULATORY_CARE_PROVIDER_SITE_OTHER): Payer: Medicare Other | Admitting: Urology

## 2019-10-08 ENCOUNTER — Encounter: Payer: Self-pay | Admitting: Urology

## 2019-10-08 ENCOUNTER — Other Ambulatory Visit: Payer: Self-pay

## 2019-10-08 VITALS — BP 117/72 | HR 92 | Ht 73.0 in | Wt 154.4 lb

## 2019-10-08 DIAGNOSIS — R9721 Rising PSA following treatment for malignant neoplasm of prostate: Secondary | ICD-10-CM | POA: Diagnosis not present

## 2019-10-08 DIAGNOSIS — C61 Malignant neoplasm of prostate: Secondary | ICD-10-CM | POA: Diagnosis not present

## 2019-10-08 MED ORDER — TAMSULOSIN HCL 0.4 MG PO CAPS: 0.4000 mg | ORAL_CAPSULE | Freq: Every day | ORAL | 3 refills | Status: AC

## 2019-10-08 NOTE — Progress Notes (Signed)
10/08/2019 9:22 AM   Jerry Haynes 1941-07-02 [id number]  Referring provider: Baxter Hire, MD [place],  [place] [address]  No chief complaint on file.   HPI:  F/u -   1) F/u - M0 hormone sensitive recurrent prostate cancer -- He received brachytherapy for prostate cancer in 2011. PSA and pathology reports are not available. He underwent TURP in 2014 for BPH and was found to prostate cancer in the specimen. Pathology was Gleason 3+3=6 in 5% of specimen. He elected to undergo surveillance that time.PSA at that time was 0.6. PSA in 2.17 in 10/2015. PSA in December 2017 was 3.2. PSA rose to 4.1 in June 2018.DRE benign Dec 2017. He underwent a CT and bone scan for his biochemical recurrence. These were both negative. He was startedandrogen deprivation therapy for biochemical recurrence of prostate cancer Aug 2018. His PSA decreased to 0.7 in November 2018. T < 3. We discussed intermittent ADT. PSA rose to 1.04 May 2018 with a T of 349. Last Eligard 45 mg 09/22/2019. PSA undetectable. T < 3.   2) BPH - He is currently on flomax for his BPH. TURP in 2014 as above.   Today, patient is seen for the above. His PSA is undetectable. He has no voiding complaints. He has a good flow. He continues tamsulosin.  No gross hematuria.   PMH: Past Medical History:  Diagnosis Date  . BPH (benign prostatic hyperplasia)   . GERD (gastroesophageal reflux disease)   . Hypercholesteremia   . Hypertension   . Male erectile dysfunction   . Microscopic hematuria   . Normocytic anemia   . Polycythemia   . Prostate cancer ([place]) 2008   Gleeson 8, seed implant in 2011  . Urinary frequency     Surgical History: Past Surgical History:  Procedure Laterality Date  . CYSTOSCOPY  03/2013   by Dr. Bernardo Heater  . TRANSURETHRAL RESECTION OF PROSTATE  2015    Home Medications:  Allergies as of 10/08/2019   No Known Allergies     Medication List       Accurate as of October 08, 2019   9:22 AM. If you have any questions, ask your nurse or doctor.        amLODipine 5 MG tablet Commonly known as: NORVASC Take 5 mg by mouth daily.   donepezil 5 MG tablet Commonly known as: ARICEPT   simvastatin 20 MG tablet Commonly known as: ZOCOR Take 20 mg by mouth daily at 6 PM.   tamsulosin 0.4 MG Caps capsule Commonly known as: FLOMAX Take 4 mg by mouth daily.   vitamin B-12 1000 MCG tablet Commonly known as: CYANOCOBALAMIN Take 1 tablet (1,000 mcg total) by mouth daily.       Allergies: No Known Allergies  Family History: Family History  Problem Relation Age of Onset  . Prostate cancer Brother   . Kidney cancer Neg Hx   . Bladder Cancer Neg Hx     Social History:  reports that he has been smoking cigarettes. He has a 12.75 pack-year smoking history. He has never used smokeless tobacco. He reports that he does not drink alcohol or use drugs.  ROS:                                        Physical Exam: There were no vitals taken for this visit.  Constitutional:  Alert and oriented, No acute distress. HEENT: [place] AT, moist mucus membranes.  Trachea midline, no masses. Cardiovascular: No clubbing, cyanosis, or edema. Respiratory: Normal respiratory effort, no increased work of breathing. GI: Abdomen is soft, nontender, nondistended, no abdominal masses GU: No CVA tenderness Lymph: No cervical or inguinal lymphadenopathy. Skin: No rashes, bruises or suspicious lesions. Neurologic: Grossly intact, no focal deficits, moving all 4 extremities. Psychiatric: Normal mood and affect.  Laboratory Data: Lab Results  Component Value Date   WBC 6.5 09/27/2019   HGB 12.5 (L) 09/27/2019   HCT 38.0 (L) 09/27/2019   MCV 91.6 09/27/2019   PLT 234 09/27/2019    Lab Results  Component Value Date   CREATININE 0.83 11/04/2017    Lab Results  Component Value Date   PSA 2.17 10/20/2015   PSA  12/28/2010    ========== TEST NAME ==========   ========= RESULTS =========  = REFERENCE RANGE =  PROSTATE-SPECIFIC AG.  PSA, Serum (Serial Monitor) Prostate Specific Ag, Serum     [   2.5 ng/mL            ]           0.0-4.0 Roche ECLIA methodology.                                                                      . According to the American Urological Association, Serum PSA should decrease and remain at undetectable levels after radical prostatectomy. The AUA defines biochemical recurrence as an initial PSA value 0.2 ng/mL or greater followed by a subsequent confirmatory PSA value 0.2 ng/mL or greater. Values obtained with different assay methods or kits cannot be used interchangeably. Results cannot be interpreted as absolute evidence of the presence or absence of malignant disease.               [facility]            No: [id number]           [address] [place], [place] [address]           Lindon Romp, MD         [phone]   Result(s) reported on 29 Dec 2010 at 05:17PM.    PSA  09/17/2010    ========== TEST NAME ==========  ========= RESULTS =========  = REFERENCE RANGE =  PROSTATE-SPECIFIC AG.  PSA, Serum (Serial Monitor) Prostate Specific Ag, Serum     [H  9.8 ng/mL            ]           0.0-4.0 Roche ECLIA methodology.                                                                      . According to the American Urological Association, Serum PSA should decrease and remain at undetectable levels after radical prostatectomy. The AUA defines biochemical recurrence as an initial PSA value 0.2 ng/mL or greater followed by a subsequent confirmatory PSA  value 0.2 ng/mL or greater. Values obtained with different assay methods or kits cannot be used interchangeably. Results cannot be interpreted as absolute evidence of the presence or absence of malignant disease.               [facility] [place]            No: [id number]           [address] [place], [place] [address]            Lindon Romp, MD         [phone]   Result(s) reported on 18 Sep 2010 at 12:49PM.     Lab Results  Component Value Date   TESTOSTERONE <3 (L) 09/22/2019    No results found for: HGBA1C  Urinalysis    Component Value Date/Time   COLORURINE AMBER (A) 11/04/2017 1854   APPEARANCEUR HAZY (A) 11/04/2017 1854   LABSPEC 1.024 11/04/2017 1854   PHURINE 5.0 11/04/2017 1854   GLUCOSEU NEGATIVE 11/04/2017 [place] NEGATIVE 11/04/2017 [place] NEGATIVE 11/04/2017 [place] NEGATIVE 11/04/2017 1854   PROTEINUR 30 (A) 11/04/2017 1854   NITRITE NEGATIVE 11/04/2017 1854   LEUKOCYTESUR NEGATIVE 11/04/2017 1854    Lab Results  Component Value Date   BACTERIA NONE SEEN 11/04/2017    Pertinent Imaging: n/a No results found for this or any previous visit. No results found for this or any previous visit. No results found for this or any previous visit. No results found for this or any previous visit. No results found for this or any previous visit. No results found for this or any previous visit. No results found for this or any previous visit. No results found for this or any previous visit.  Assessment & Plan:    PCa - again discussed rationale for IADT, management of local recurrence (surveillance vs cryo). He will not need Eligard until PSA get up to about 4.   No follow-ups on file.  Festus Aloe, MD  [facility] [address] [place], [place] [address] [phone]

## 2019-10-08 NOTE — Patient Instructions (Signed)
Hormone Suppression Therapy for Prostate Cancer    Hormone suppression therapy is a treatment that can help to slow the growth of cancer cells in the prostate (prostate gland). It is also called androgen deprivation therapy (ADT). Hormone suppression therapy targets hormones in the body called androgens that may help cancer cells grow. This therapy reduces the amount of these hormones in the body or keeps the body from using them.  Hormone suppression therapy alone will not cure prostate cancer, but it can slow the growth of prostate cancer and may shrink tumors over time. Your health care provider can help you find the most effective way to treat your type of prostate cancer and best fit your lifestyle.  Types of hormone suppression therapy  Orchiectomy  Orchiectomy, also called surgical castration, is a surgery to remove one or both testicles. The testicles are where the two main androgens (testosterone and dihydrotestosterone) are made.  Medicine therapy  Medicine therapy, also called chemical castration, involves taking medicines to keep your body from making or using androgens. If you choose this therapy, you may take any of these medicines:   Luteinizing hormone-releasing hormone (LHRH) agonist medicines. These medicines are injected or implanted under your skin to lower the amount of androgens that your testicles make. If you take these medicines, you may also be prescribed other medicines to help with side effects. Common LHRH agonist medicines include:  ? Leuprolide.  ? Goserelin.  ? Histrelin.  ? Triptorelin.   LHRH antagonist medicines. These medicines are like LHRH agonist medicines but they work faster. They are commonly used to prevent side effects when prostate cancer is in an advanced stage. A common LHRH antagonist medicine is degarelix.   CYP17 inhibitor medicines. These medicines help to stop other glands from making androgens. They may be used if the prostate cancer is advanced and has not  gotten better with surgery or other medicine treatment. A steroid medicine may be given with this type of medicine to help with side effects. A common CYP17 inhibitor medicine is abiraterone.    Anti-androgen medicines  Anti-androgen medicines block areas on the body where androgens attach. This prevents the androgens from coming into contact with cancer cells and fueling their growth. These medicines include:   Flutamide.   Bicalutamide.   Enzalutamide.   Nilutamide.  Androgen-suppressing medicines  Androgen-suppressing medicines may be used if other hormone suppression treatments are not working. They are not commonly used, however, because of their side effects. These medicines include:   Estrogen.   Ketoconazole.  What are the risks?  Hormone suppression therapy may cause side effects, including:   Hot flashes.   A decrease or lack of sexual desire.   Erectile dysfunction (impotence).   A decrease in the size of the penis or testicles.   Breast tenderness.   An increase in breast size.   Fatigue.   Weight gain.   Thinning of the bones (osteoporosis).   Anemia.   Loss of muscle.   Depression.   Increased cholesterol levels.   Trouble with thinking or focusing.   Stomach upset and nausea.   Diarrhea.  Hormone suppression therapy can also increase your risk of these problems:   High blood pressure (hypertension).   Stroke.   Diabetes.   Heart attack.   Heart disease.  What are the benefits?  One of the main benefits of hormone suppression therapy is having additional treatment options. You may have only one type of treatment or two or more types   side effects that do not get better  with treatment.  You have trouble urinating.  You have new side effects that do not go away. Get help right away if:  You have severe chest pain.  You have trouble breathing.  You have an irregular heartbeat.  You have numbness or paralysis in the lower half of your body.  You are confused.  You have trouble talking or understanding. These symptoms may be an emergency. Do not wait to see if the symptoms will go away. Get medical help right away. Call your local emergency services (911 in the [place] Do not drive yourself to the hospital. Summary  Hormone suppression therapy is a treatment that can help to slow the growth of cancer cells in the prostate (prostate gland).  Hormone suppression therapy alone will not cure prostate cancer, but it can slow the growth of prostate cancer and may shrink tumors over time.  Treatment to suppress hormones may include surgery or medicines. This information is not intended to replace advice given to you by your health care provider. Make sure you discuss any questions you have with your health care provider. Document Revised: 08/30/2016 Document Reviewed: 07/24/2016 Elsevier Patient Education  2020 [employer] Inc[employer]  

## 2020-02-29 ENCOUNTER — Telehealth: Payer: Self-pay

## 2020-02-29 NOTE — Telephone Encounter (Signed)
Called pt no answer. Pt needs to be informed that his appointments were moved.

## 2020-03-01 NOTE — Telephone Encounter (Signed)
Mailed appointments

## 2020-03-16 ENCOUNTER — Other Ambulatory Visit: Payer: Self-pay

## 2020-03-16 ENCOUNTER — Encounter: Payer: Self-pay | Admitting: Urology

## 2020-03-21 ENCOUNTER — Ambulatory Visit: Payer: Medicare Other

## 2020-03-22 ENCOUNTER — Other Ambulatory Visit: Payer: Self-pay

## 2020-03-24 ENCOUNTER — Ambulatory Visit: Payer: Self-pay | Admitting: Urology

## 2020-04-13 ENCOUNTER — Other Ambulatory Visit: Payer: Medicare Other

## 2020-04-19 ENCOUNTER — Other Ambulatory Visit: Payer: Self-pay

## 2020-04-21 ENCOUNTER — Ambulatory Visit: Payer: Self-pay | Admitting: Urology

## 2021-03-09 ENCOUNTER — Other Ambulatory Visit: Payer: Self-pay

## 2021-03-09 ENCOUNTER — Other Ambulatory Visit: Payer: Self-pay | Admitting: Family Medicine

## 2021-03-09 ENCOUNTER — Ambulatory Visit
Admission: RE | Admit: 2021-03-09 | Discharge: 2021-03-09 | Disposition: A | Discharge status: Home / Self Care | Payer: Medicare Other | Source: Ambulatory Visit | Attending: Family Medicine | Admitting: Family Medicine

## 2021-03-09 DIAGNOSIS — I6782 Cerebral ischemia: Secondary | ICD-10-CM | POA: Diagnosis not present

## 2021-03-09 DIAGNOSIS — Y92009 Unspecified place in unspecified non-institutional (private) residence as the place of occurrence of the external cause: Secondary | ICD-10-CM | POA: Insufficient documentation

## 2021-03-09 DIAGNOSIS — R42 Dizziness and giddiness: Secondary | ICD-10-CM | POA: Diagnosis present

## 2021-03-09 DIAGNOSIS — W19XXXA Unspecified fall, initial encounter: Secondary | ICD-10-CM
